# Patient Record
Sex: Male | Born: 1970 | Race: White | Hispanic: No | Marital: Married | Smoking: Former smoker
Health system: Southern US, Community
[De-identification: ages and names within clinical notes are randomized; demographics above are authoritative.]

## PROBLEM LIST (undated history)

## (undated) DIAGNOSIS — I10 Essential (primary) hypertension: Secondary | ICD-10-CM

## (undated) DIAGNOSIS — R42 Dizziness and giddiness: Secondary | ICD-10-CM

## (undated) DIAGNOSIS — E785 Hyperlipidemia, unspecified: Secondary | ICD-10-CM

## (undated) DIAGNOSIS — M1990 Unspecified osteoarthritis, unspecified site: Secondary | ICD-10-CM

## (undated) DIAGNOSIS — C4491 Basal cell carcinoma of skin, unspecified: Secondary | ICD-10-CM

## (undated) HISTORY — PX: COLONOSCOPY: SHX174

## (undated) HISTORY — PX: FOOT SURGERY: SHX648

## (undated) HISTORY — PX: BASAL CELL CARCINOMA EXCISION: SHX1214

## (undated) HISTORY — DX: Hyperlipidemia, unspecified: E78.5

## (undated) HISTORY — PX: JOINT REPLACEMENT: SHX530

---

## 2017-10-25 LAB — BASIC METABOLIC PANEL: Glucose: 92

## 2017-10-25 LAB — LIPID PANEL
HDL: 43 (ref 35–70)
Triglycerides: 136 (ref 40–160)

## 2018-05-16 ENCOUNTER — Encounter (INDEPENDENT_AMBULATORY_CARE_PROVIDER_SITE_OTHER): Payer: Self-pay | Admitting: Orthopaedic Surgery

## 2018-05-16 ENCOUNTER — Ambulatory Visit (INDEPENDENT_AMBULATORY_CARE_PROVIDER_SITE_OTHER): Payer: 59

## 2018-05-16 ENCOUNTER — Other Ambulatory Visit: Payer: Self-pay

## 2018-05-16 ENCOUNTER — Ambulatory Visit (INDEPENDENT_AMBULATORY_CARE_PROVIDER_SITE_OTHER): Payer: 59 | Admitting: Orthopaedic Surgery

## 2018-05-16 VITALS — Ht 72.0 in | Wt 200.0 lb

## 2018-05-16 DIAGNOSIS — M1612 Unilateral primary osteoarthritis, left hip: Secondary | ICD-10-CM

## 2018-05-16 NOTE — Progress Notes (Signed)
Sent this note back to the PA-C who created it.    I explained I was not the primary care provider and the patient has never been seen here /has never established care here.

## 2018-05-16 NOTE — Progress Notes (Signed)
Office Visit Note   Patient: Randy Bell           Date of Birth: 04/01/1970           MRN: [id number] Visit Date: 05/16/2018              Requested by: No referring provider defined for this encounter. PCP: Mellody Dance, DO   Assessment & Plan: Visit Diagnoses:  1. Primary osteoarthritis of left hip     Plan: Discussed with him at length his radiographic findings.  Also Dr. Ninfa Linden and myself discussed with him that if intra-articular injections are working for him that he can continue with this.  However if some point time the begin to be not effective for his hip arthritis becomes worse that he would be candidate for a total hip replacement.  Questions were encouraged and answered at length by Dr. Ninfa Linden myself.  He wishes to have a intra-articular injection in the near future with Dr. Junius Roads here in our office under ultrasound.  He will let us know what type of response he had to this injection.  Follow-up with [place] as needed.  Follow-Up Instructions: Return if symptoms worsen or fail to improve.   Orders:  Orders Placed This Encounter  Procedures   XR HIP UNILAT W OR W/O PELVIS 1V LEFT   No orders of the defined types were placed in this encounter.     Procedures: No procedures performed   Clinical Data: No additional findings.   Subjective: Chief Complaint  Patient presents with   Left Hip - Pain    HPI Randy Bell is a 48 year old male comes in today for second opinion on his left hip arthritis.  He has been seen by an orthopedic physician here in town he was found to have moderate to severe hip arthritis of the left hip based on the surgeon's note.  He was sent for intra-articular injections in the hip which hip given him good relief lasting anywhere from 6 months to a year.  He has had a total of 6 injections.  He is now having increased pain in the left hip since undergoing an intra-articular injection on 09/27/2017.  Has had no new injury to the left hip.   He is starting to have some right hip pain.  However he states is nothing like the left hip pain he gets to be between injections.  He has pain in the left hip spine ongoing for the past 3 years.  He does like to cycle play basketball with his daughter.  He is nondiabetic.  Review of Systems Negative for fevers chills.  Please see HPI otherwise negative or noncontributory.  Objective: Vital Signs: Ht 6' (1.829 m)    Wt 200 lb (90.7 kg)    BMI 27.12 kg/m   Physical Exam Constitutional:      Appearance: He is not ill-appearing or diaphoretic.  Pulmonary:     Effort: Pulmonary effort is normal.  Neurological:     Mental Status: He is alert and oriented to person, place, and time.  Psychiatric:        Mood and Affect: Mood normal.     Ortho Exam Right hip good range of motion without pain.  Left hip with limited internal and external rotation with discomfort. Specialty Comments:  No specialty comments available.  Imaging: Xr Hip Unilat W Or W/o Pelvis 1v Left  Result Date: 05/16/2018 AP pelvis lateral view of the left hip: No acute fractures.  Signs  of impingement both hips left greater than right.  Narrowing of the left hip joint moderate.  Mild to moderate narrowing right hip.  Both hips are well located.    PMFS History: Patient Active Problem List   Diagnosis Date Noted   Primary osteoarthritis of left hip 05/16/2018   No past medical history on file.   Social History   Occupational History   Not on file  Tobacco Use   Smoking status: Not on file  Substance and Sexual Activity   Alcohol use: Not on file   Drug use: Not on file   Sexual activity: Not on file

## 2018-05-17 ENCOUNTER — Encounter (INDEPENDENT_AMBULATORY_CARE_PROVIDER_SITE_OTHER): Payer: Self-pay | Admitting: Family Medicine

## 2018-05-17 ENCOUNTER — Ambulatory Visit (INDEPENDENT_AMBULATORY_CARE_PROVIDER_SITE_OTHER): Payer: 59 | Admitting: Family Medicine

## 2018-05-17 DIAGNOSIS — M1612 Unilateral primary osteoarthritis, left hip: Secondary | ICD-10-CM | POA: Diagnosis not present

## 2018-05-17 MED ORDER — METHYLPREDNISOLONE ACETATE 40 MG/ML IJ SUSP: 40.0000 mg | Freq: Once | INTRAMUSCULAR | Status: DC

## 2018-05-17 NOTE — Progress Notes (Signed)
Subjective: Patient is here for ultrasound-guided intra-articular left hip injection.  Objective:  Pain and decreased ROM with passive IR.  Procedure: Ultrasound-guided left hip injection: After sterile prep with Betadine, injected 8 cc 1% lidocaine without epinephrine and 40 mg methylprednisolone using a 22-gauge spinal needle, passing the needle through the iliofemoral ligament into the femoral head/neck junction.  injectate was seen filling the joint capsule.  RTC prn with Dr. Ninfa Linden.  Also try to eliminate sugar from diet to help with joint pain.

## 2018-08-01 ENCOUNTER — Other Ambulatory Visit: Payer: Self-pay

## 2018-08-01 ENCOUNTER — Encounter: Payer: Self-pay | Admitting: Family Medicine

## 2018-08-01 ENCOUNTER — Ambulatory Visit (INDEPENDENT_AMBULATORY_CARE_PROVIDER_SITE_OTHER): Payer: 59 | Admitting: Family Medicine

## 2018-08-01 VITALS — BP 136/86 | HR 56 | Temp 98.1°F | Ht 72.0 in | Wt 205.4 lb

## 2018-08-01 DIAGNOSIS — R001 Bradycardia, unspecified: Secondary | ICD-10-CM

## 2018-08-01 DIAGNOSIS — M1612 Unilateral primary osteoarthritis, left hip: Secondary | ICD-10-CM

## 2018-08-01 DIAGNOSIS — Z83438 Family history of other disorder of lipoprotein metabolism and other lipidemia: Secondary | ICD-10-CM | POA: Insufficient documentation

## 2018-08-01 DIAGNOSIS — R42 Dizziness and giddiness: Secondary | ICD-10-CM

## 2018-08-01 DIAGNOSIS — Z823 Family history of stroke: Secondary | ICD-10-CM | POA: Insufficient documentation

## 2018-08-01 DIAGNOSIS — Z7689 Persons encountering health services in other specified circumstances: Secondary | ICD-10-CM

## 2018-08-01 DIAGNOSIS — R002 Palpitations: Secondary | ICD-10-CM | POA: Diagnosis not present

## 2018-08-01 NOTE — Progress Notes (Signed)
New patient office visit note:  Impression and Recommendations:    1. Encounter to establish care with new doctor   2. 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   3. Fluttering sensation of heart-lasts a couple seconds only   4. Bradycardia, unspecified   5. Family history of hyperlipidemia- Mom and Dad   72. Family history of stroke or transient ischemic attack in father   50. Primary osteoarthritis of left hip-sees Dr. Zollie Beckers     Encounter to establish care with new doctor - Plan: Needs CPE with fasting blood work near future.  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   - Plan: Advised to check blood pressure and pulse at home.  Especially if has symptoms.  His symptoms are chronic and have not changed in many many years.  Extensive counseling done with patient regarding possible causes for these symptoms  Fluttering sensation of heart-lasts a couple seconds only    - Plan:  Red flag sx d/c pt- he has none. If dev- call us or to ED if serious.  Fluttering sensation in the heart only last 2 to 3 seconds and only notices it when he is resting.  Never notices it with activity or exertion.  Sx not getting more frequent and has been a chronic symptom he has had for many many months now without change.  Reassuring that it only is noticeable with rest and only lasts a couple seconds. - Extensive counseling done with patient regarding possible causes for these symptoms -Obtain labs to look for electrolyte abnormality, thyroid abnormality etc. Etc. -If labs are within normal limits, and symptoms worsen or do not improve at all, we will send patient for [facility] monitoring plus minus cards eval -For now advised to check blood pressure and pulse whenever this happens and try to know if this is related to any medications such as decongestants etc..  He will bring this symptom log in next OV when we have a chronic follow-up.  Family history of hyperlipidemia- Mom and Dad - Plan: We  will check labs near future.  Family history of stroke or transient ischemic attack in father   Primary osteoarthritis of left hip-sees Dr. Zollie Beckers - Plan: We will continue with Ortho as needed    Education and routine counseling performed. Handouts provided.    Patient declines labs today and wishes to come in for a physical and get fasting blood work all at once.  Advised patient to do this in the near future.   Medications Discontinued During This Encounter  Medication Reason  . methylPREDNISolone acetate (DEPO-MEDROL) injection 40 mg Completed Course    Expresses verbal understanding and consents to current therapy plan and treatment regimen.   Return for Follow-up next available for complete physical and come fasting.  (Within a couple weeks)   Please see AVS handed out to patient at the end of our visit for further patient instructions/ counseling done pertaining to today's office visit.    Note:  This document was prepared using Dragon voice recognition software and may include unintentional dictation errors.  ---------------------------------------------------------------------------------------------------------------------------------------------------------------------------------------------    Subjective:    Chief complaint:   Chief Complaint  Patient presents with  . Establish Care     HPI: Randy Bell is a pleasant 48 y.o. male who presents to [place] [facility] today to review their medical history with me and establish care.   I asked the patient to review their chronic problem list  with me to ensure everything was updated and accurate.    All recent office visits with other providers, any medical records that patient brought in etc  - I reviewed today.     We asked pt to get [place] their medical records from [facility] providers/ specialists that they had seen within the past 3-5 years- if they are in private practice and/or do not  work for [employer], [facility], [place], [place] or [employer] owned practice.  Told them to call their specialists to clarify this if they are not sure.    *last time at doc office was at age 48yo. Has not been since because he does not believe in them. Here for wife Lexie whom I see.   Long h/o medical noncompliance.   L hip-seen by Ortho.  Symptoms stable.  Continues to follow-up with them.  Changed to chris blackman recently   Bruised his ribs back a week or so ago.  Symptoms are not terrible, no shortness of breath, difficulty lying flat, pain with chest excursion etc.  No concerns today.  Just wanted to let me know.   At age 17 yo- got up to awaken from sleep to hit alarm clock and he passed right out- got lightheadedness.    NO LOC.  No memory loss.  Never happened again "until recently a little."   Just wanted to let me know.   This was last time he went to the doctors.     Rides bike 100 miles w/o difficulty but when I pick up daughjter and put her in bed and go to stand up too quick - feels dizzy- woozy/ lightheaded,  no vertigo.  No focal neurological symptoms.  No associated heart palpitations, visual changes, headaches etc. does not know what his baseline heart rate and blood pressures are.   Feels like heart is fluttering occasionally- maybe once monthly or once every couple months- lasts just "a few seconds."  Going on "long time now".   Only occ occurs with rest - is when he notices it.  With intense exercise- no sx ever / never noticed it.       Wt Readings from Last 3 Encounters:  08/01/18 205 lb 6.4 oz (93.2 kg)  05/16/18 200 lb (90.7 kg)   BP Readings from Last 3 Encounters:  08/01/18 136/86   Pulse Readings from Last 3 Encounters:  08/01/18 (!) 56   BMI Readings from Last 3 Encounters:  08/01/18 27.86 kg/m  05/16/18 27.12 kg/m    Patient Care Team    Relationship Specialty Notifications Start End  Mellody Dance, DO PCP - General Family Medicine  08/01/18    Eunice Blase, MD  Family Medicine  08/01/18   Mcarthur Rossetti, MD Consulting Physician Orthopedic Surgery  08/01/18    Comment: L hip    Patient Active Problem List   Diagnosis Date Noted  . Bradycardia, unspecified 08/01/2018    Priority: High  . Fluttering sensation of heart- with rest and lasts 2-3 sec 08/01/2018    Priority: High  . 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 Primary osteoarthritis of left hip 05/16/2018    Priority: Medium  . Family history of stroke or transient ischemic attack in father 08/01/2018  . Family history of hyperlipidemia- Mom and Dad 08/01/2018       As reported by pt:  History reviewed. No pertinent past medical history.   History reviewed. No pertinent surgical history.   Family History  Problem Relation Age of Onset  . Hyperlipidemia Mother   . Hyperlipidemia Father   . Stroke Father      Social History   Substance and Sexual Activity  Drug Use Never     Social History   Substance and Sexual Activity  Alcohol Use Yes  . Alcohol/week: 6.0 standard drinks  . Types: 6 Standard drinks or equivalent per week     Social History   Tobacco Use  Smoking Status Former Smoker  . Years: 3.00  . Types: Cigarettes  Smokeless Tobacco Never Used  Tobacco Comment   quit 30 years      No outpatient medications have been marked as taking for the 08/01/18 encounter (Office Visit) with Mellody Dance, DO.    Allergies: Patient has no known allergies.   Review of Systems  Constitutional: Negative for chills, diaphoresis, fever, malaise/fatigue and weight loss.  HENT: Negative for congestion, sore throat and tinnitus.   Eyes: Negative for blurred vision, double vision and photophobia.  Respiratory: Negative for cough and wheezing.   Cardiovascular: Positive for palpitations. Negative for chest pain.       Occ sx bradycardia  Gastrointestinal: Negative for blood in stool, diarrhea, nausea  and vomiting.  Genitourinary: Negative for dysuria, frequency and urgency.  Musculoskeletal: Positive for joint pain. Negative for myalgias.       L Hip   Skin: Negative for itching and rash.  Neurological: Positive for dizziness. Negative for focal weakness, weakness and headaches.       Orthostatic dizziness   Endo/Heme/Allergies: Negative for environmental allergies and polydipsia. Does not bruise/bleed easily.  Psychiatric/Behavioral: Negative for depression and memory loss. The patient is not nervous/anxious and does not have insomnia.       Objective:   Blood pressure 136/86, pulse (!) 56, temperature 98.1 F (36.7 C), height 6' (1.829 m), weight 205 lb 6.4 oz (93.2 kg), SpO2 98 %. Body mass index is 27.86 kg/m. General: Well Developed, well nourished, and in no acute distress.  Neuro: Alert and oriented x3, extra-ocular muscles intact, sensation grossly intact.  HEENT:[place]/AT, PERRLA, neck supple, No carotid bruits Skin: no gross rashes  Cardiac: Regular rate and rhythm, no M/R/G Respiratory: Essentially clear to auscultation bilaterally. Not using accessory muscles, speaking in full sentences. No palpable localized pain in ribs, no boney TTP, no STS or bruise apprec Abdominal: not grossly distended Musculoskeletal: Ambulates w/o diff, FROM * 4 ext.  Vasc: less 2 sec cap RF, warm and pink  Psych:  No HI/SI, judgement and insight good, Euthymic mood. Full Affect.    No results found for this or any previous visit (from the past 2160 hour(s)).

## 2018-08-01 NOTE — Patient Instructions (Signed)
Please realize, EXERCISE IS MEDICINE!  -  American Heart Association [facility]) guidelines for exercise : If you are in good health, without any medical conditions, you should engage in 150-300 minutes of moderate intensity aerobic activity per week.  This means you should be huffing and puffing throughout your workout.   Engaging in regular exercise will improve brain function and memory, as well as improve mood, boost immune system and help with weight management.  As well as the other, more well-known effects of exercise such as decreasing blood sugar levels, decreasing blood pressure,  and decreasing bad cholesterol levels/ increasing good cholesterol levels.     -  The AHA strongly endorses consumption of a diet that contains a variety of foods from all the food categories with an emphasis on fruits and vegetables; fat-free and low-fat dairy products; cereal and grain products; legumes and nuts; and fish, poultry, and/or extra lean meats.    Excessive food intake, especially of foods high in saturated and trans fats, sugar, and salt, should be avoided.    Adequate water intake of roughly 1/2 of your weight in pounds, should equal the ounces of water per day you should drink.  So for instance, if you're 200 pounds, that would be 100 ounces of water per day.  Additionally if you are exercising you should add 1 bottle water for every 30 minutes of exercise or activity that causes sweating         Mediterranean Diet  Why follow it? Research shows. . Those who follow the Mediterranean diet have a reduced risk of heart disease  . The diet is associated with a reduced incidence of Parkinson's and Alzheimer's diseases . People following the diet may have longer life expectancies and lower rates of chronic diseases  . The Dietary Guidelines for Americans recommends the Mediterranean diet as an eating plan to promote health and prevent disease  What Is the Mediterranean Diet?  . Healthy eating plan based  on typical foods and recipes of Mediterranean-style cooking . The diet is primarily a plant based diet; these foods should make up a majority of meals   Starches - Plant based foods should make up a majority of meals - They are an important sources of vitamins, minerals, energy, antioxidants, and fiber - Choose whole grains, foods high in fiber and minimally processed items  - Typical grain sources include wheat, oats, barley, corn, brown rice, bulgar, farro, millet, polenta, couscous  - Various types of beans include chickpeas, lentils, fava beans, black beans, white beans   Fruits  Veggies - Large quantities of antioxidant rich fruits & veggies; 6 or more servings  - Vegetables can be eaten raw or lightly drizzled with oil and cooked  - Vegetables common to the traditional Mediterranean Diet include: artichokes, arugula, beets, broccoli, brussel sprouts, cabbage, carrots, celery, collard greens, cucumbers, eggplant, kale, leeks, lemons, lettuce, mushrooms, okra, onions, peas, peppers, potatoes, pumpkin, radishes, rutabaga, shallots, spinach, sweet potatoes, turnips, zucchini - Fruits common to the Mediterranean Diet include: apples, apricots, avocados, cherries, clementines, dates, figs, grapefruits, grapes, melons, nectarines, oranges, peaches, pears, pomegranates, strawberries, tangerines  Fats - Replace butter and margarine with healthy oils, such as olive oil, canola oil, and tahini  - Limit nuts to no more than a handful a day  - Nuts include walnuts, almonds, pecans, pistachios, pine nuts  - Limit or avoid candied, honey roasted or heavily salted nuts - Olives are central to the Mediterranean diet - can be eaten whole  or used in a variety of dishes   Meats Protein - Limiting red meat: no more than a few times a month - When eating red meat: choose lean cuts and keep the portion to the size of deck of cards - Eggs: approx. 0 to 4 times a week  - Fish and lean poultry: at least 2 a week  -  Healthy protein sources include, chicken, [place], lean beef, lamb - Increase intake of seafood such as tuna, salmon, trout, mackerel, shrimp, scallops - Avoid or limit high fat processed meats such as sausage and bacon  Dairy - Include moderate amounts of low fat dairy products  - Focus on healthy dairy such as fat free yogurt, skim milk, low or reduced fat cheese - Limit dairy products higher in fat such as whole or 2% milk, cheese, ice cream  Alcohol - Moderate amounts of red wine is ok  - No more than 5 oz daily for women (all ages) and men older than age 2  - No more than 10 oz of wine daily for men younger than 45  Other - Limit sweets and other desserts  - Use herbs and spices instead of salt to flavor foods  - Herbs and spices common to the traditional Mediterranean Diet include: basil, bay leaves, chives, cloves, cumin, fennel, garlic, lavender, marjoram, mint, oregano, parsley, pepper, rosemary, sage, savory, sumac, tarragon, thyme   It's not just a diet, it's a lifestyle:  . The Mediterranean diet includes lifestyle factors typical of those in the region  . Foods, drinks and meals are best eaten with others and savored . Daily physical activity is important for overall good health . This could be strenuous exercise like running and aerobics . This could also be more leisurely activities such as walking, housework, yard-work, or taking the stairs . Moderation is the key; a balanced and healthy diet accommodates most foods and drinks . Consider portion sizes and frequency of consumption of certain foods   Meal Ideas & Options:  . Breakfast:  o Whole wheat toast or whole wheat English muffins with peanut butter & hard boiled egg o Steel cut oats topped with apples & cinnamon and skim milk  o Fresh fruit: banana, strawberries, melon, berries, peaches  o Smoothies: strawberries, bananas, greek yogurt, peanut butter o Low fat greek yogurt with blueberries and granola  o Egg white  omelet with spinach and mushrooms o Breakfast couscous: whole wheat couscous, apricots, skim milk, cranberries  . Sandwiches:  o Hummus and grilled vegetables (peppers, zucchini, squash) on whole wheat bread   o Grilled chicken on whole wheat pita with lettuce, tomatoes, cucumbers or tzatziki  o Tuna salad on whole wheat bread: tuna salad made with greek yogurt, olives, red peppers, capers, green onions o Garlic rosemary lamb pita: lamb sauted with garlic, rosemary, salt & pepper; add lettuce, cucumber, greek yogurt to pita - flavor with lemon juice and black pepper  . Seafood:  o Mediterranean grilled salmon, seasoned with garlic, basil, parsley, lemon juice and black pepper o Shrimp, lemon, and spinach whole-grain pasta salad made with low fat greek yogurt  o Seared scallops with lemon orzo  o Seared tuna steaks seasoned salt, pepper, coriander topped with tomato mixture of olives, tomatoes, olive oil, minced garlic, parsley, green onions and cappers  . Meats:  o Herbed greek chicken salad with kalamata olives, cucumber, feta  o Red bell peppers stuffed with spinach, bulgur, lean ground beef (or lentils) & topped with feta  o Kebabs: skewers of chicken, tomatoes, onions, zucchini, squash  o [place] burgers: made with red onions, mint, dill, lemon juice, feta cheese topped with roasted red peppers . Vegetarian o Cucumber salad: cucumbers, artichoke hearts, celery, red onion, feta cheese, tossed in olive oil & lemon juice  o Hummus and whole grain pita points with a greek salad (lettuce, tomato, feta, olives, cucumbers, red onion) o Lentil soup with celery, carrots made with vegetable broth, garlic, salt and pepper  o Tabouli salad: parsley, bulgur, mint, scallions, cucumbers, tomato, radishes, lemon juice, olive oil, salt and pepper.

## 2018-09-19 ENCOUNTER — Ambulatory Visit (INDEPENDENT_AMBULATORY_CARE_PROVIDER_SITE_OTHER): Payer: 59 | Admitting: Family Medicine

## 2018-09-19 ENCOUNTER — Other Ambulatory Visit: Payer: Self-pay

## 2018-09-19 ENCOUNTER — Encounter: Payer: Self-pay | Admitting: Family Medicine

## 2018-09-19 VITALS — BP 164/82 | HR 53 | Ht 71.5 in | Wt 201.0 lb

## 2018-09-19 DIAGNOSIS — Z23 Encounter for immunization: Secondary | ICD-10-CM

## 2018-09-19 DIAGNOSIS — Z719 Counseling, unspecified: Secondary | ICD-10-CM | POA: Diagnosis not present

## 2018-09-19 DIAGNOSIS — Z0000 Encounter for general adult medical examination without abnormal findings: Secondary | ICD-10-CM | POA: Diagnosis not present

## 2018-09-19 DIAGNOSIS — Z1211 Encounter for screening for malignant neoplasm of colon: Secondary | ICD-10-CM | POA: Diagnosis not present

## 2018-09-19 LAB — POC HEMOCCULT BLD/STL (OFFICE/1-CARD/DIAGNOSTIC): Fecal Occult Blood, POC: NEGATIVE

## 2018-09-19 MED ORDER — SHINGRIX 50 MCG/0.5ML IM SUSR: 0.5000 mL | Freq: Once | INTRAMUSCULAR | 1 refills | Status: AC

## 2018-09-19 NOTE — Patient Instructions (Signed)
 Preventive Care 50 Years and Older, Male Preventive care refers to lifestyle choices and visits with your health care provider that can promote health and wellness. What does preventive care include?   A yearly physical exam. This is also called an annual well check.  Dental exams once or twice a year.  Routine eye exams. Ask your health care provider how often you should have your eyes checked.  Personal lifestyle choices, including: ? Daily care of your teeth and gums. ? Regular physical activity. ? Eating a healthy diet. ? Avoiding tobacco and drug use. ? Limiting alcohol use. ? Practicing safe sex. ? Taking low doses of aspirin every day. ? Taking vitamin and mineral supplements as recommended by your health care provider. What happens during an annual well check? The services and screenings done by your health care provider during your annual well check will depend on your age, overall health, lifestyle risk factors, and family history of disease. Counseling Your health care provider may ask you questions about your:  Alcohol use.  Tobacco use.  Drug use.  Emotional well-being.  Home and relationship well-being.  Sexual activity.  Eating habits.  History of falls.  Memory and ability to understand (cognition).  Work and work environment. Screening You may have the following tests or measurements:  Height, weight, and BMI.  Blood pressure.  Lipid and cholesterol levels. These may be checked every 5 years, or more frequently if you are over 50 years old.  Skin check.  Lung cancer screening. You may have this screening every year starting at age 55 if you have a 30-pack-year history of smoking and currently smoke or have quit within the past 15 years.  Colorectal cancer screening. All adults should have this screening starting at age 50 and continuing until age 75. You will have tests every 1-10 years, depending on your results and the type of screening  test. People at increased risk should start screening at an earlier age. Screening tests may include: ? Guaiac-based fecal occult blood testing. ? Fecal immunochemical test (FIT). ? Stool DNA test. ? Virtual colonoscopy. ? Sigmoidoscopy. During this test, a flexible tube with a tiny camera (sigmoidoscope) is used to examine your rectum and lower colon. The sigmoidoscope is inserted through your anus into your rectum and lower colon. ? Colonoscopy. During this test, a long, thin, flexible tube with a tiny camera (colonoscope) is used to examine your entire colon and rectum.  Prostate cancer screening. Recommendations will vary depending on your family history and other risks.  Hepatitis C blood test.  Hepatitis B blood test.  Sexually transmitted disease (STD) testing.  Diabetes screening. This is done by checking your blood sugar (glucose) after you have not eaten for a while (fasting). You may have this done every 1-3 years.  Abdominal aortic aneurysm (AAA) screening. You may need this if you are a current or former smoker.  Osteoporosis. You may be screened starting at age 70 if you are at high risk. Talk with your health care provider about your test results, treatment options, and if necessary, the need for more tests. Vaccines Your health care provider may recommend certain vaccines, such as:  Influenza vaccine. This is recommended every year.  Tetanus, diphtheria, and acellular pertussis (Tdap, Td) vaccine. You may need a Td booster every 10 years.  Varicella vaccine. You may need this if you have not been vaccinated.  Zoster vaccine. You may need this after age 60.  Measles, mumps, and rubella (MMR)   vaccine. You may need at least one dose of MMR if you were born in 1957 or later. You may also need a second dose.  Pneumococcal 13-valent conjugate (PCV13) vaccine. One dose is recommended after age 24.  Pneumococcal polysaccharide (PPSV23) vaccine. One dose is recommended  after age 30.  Meningococcal vaccine. You may need this if you have certain conditions.  Hepatitis A vaccine. You may need this if you have certain conditions or if you travel or work in places where you may be exposed to hepatitis A.  Hepatitis B vaccine. You may need this if you have certain conditions or if you travel or work in places where you may be exposed to hepatitis B.  Haemophilus influenzae type b (Hib) vaccine. You may need this if you have certain risk factors. Talk to your health care provider about which screenings and vaccines you need and how often you need them. This information is not intended to replace advice given to you by your health care provider. Make sure you discuss any questions you have with your health care provider. Document Released: 02/07/2015 Document Revised: 03/03/2017 Document Reviewed: 11/12/2014 Elsevier Interactive Patient Education  2019 [place] for Adults, Male A healthy lifestyle and preventive care can promote health and wellness. Preventive health guidelines for men include the following key practices:  A routine yearly physical is a good way to check with your health care provider about your health and preventative screening. It is a chance to share any concerns and updates on your health and to receive a thorough exam.  Visit your dentist for a routine exam and preventative care every 6 months. Brush your teeth twice a day and floss once a day. Good oral hygiene prevents tooth decay and gum disease.  The frequency of eye exams is based on your age, health, family medical history, use of contact lenses, and other factors. Follow your health care provider's recommendations for frequency of eye exams.  Eat a healthy diet. Foods such as vegetables, fruits, whole grains, low-fat dairy products, and lean protein foods contain the nutrients you need without too many calories. Decrease your intake of foods high in  solid fats, added sugars, and salt. Eat the right amount of calories for you. Get information about a proper diet from your health care provider, if necessary.  Regular physical exercise is one of the most important things you can do for your health. Most adults should get at least 150 minutes of moderate-intensity exercise (any activity that increases your heart rate and causes you to sweat) each week. In addition, most adults need muscle-strengthening exercises on 2 or more days a week.  Maintain a healthy weight. The body mass index (BMI) is a screening tool to identify possible weight problems. It provides an estimate of body fat based on height and weight. Your health care provider can find your BMI and can help you achieve or maintain a healthy weight. For adults 20 years and older:  A BMI below 18.5 is considered underweight.  A BMI of 18.5 to 24.9 is normal.  A BMI of 25 to 29.9 is considered overweight.  A BMI of 30 and above is considered obese.  Maintain normal blood lipids and cholesterol levels by exercising and minimizing your intake of saturated fat. Eat a balanced diet with plenty of fruit and vegetables. Blood tests for lipids and cholesterol should begin at age 68 and be repeated every 5 years. If  your lipid or cholesterol levels are high, you are over 50, or you are at high risk for heart disease, you may need your cholesterol levels checked more frequently. Ongoing high lipid and cholesterol levels should be treated with medicines if diet and exercise are not working.  If you smoke, find out from your health care provider how to quit. If you do not use tobacco, do not start.  Lung cancer screening is recommended for adults aged 31-80 years who are at high risk for developing lung cancer because of a history of smoking. A yearly low-dose CT scan of the lungs is recommended for people who have at least a 30-pack-year history of smoking and are a current smoker or have quit within  the past 15 years. A pack year of smoking is smoking an average of 1 pack of cigarettes a day for 1 year (for example: 1 pack a day for 30 years or 2 packs a day for 15 years). Yearly screening should continue until the smoker has stopped smoking for at least 15 years. Yearly screening should be stopped for people who develop a health problem that would prevent them from having lung cancer treatment.  If you choose to drink alcohol, do not have more than 2 drinks per day. One drink is considered to be 12 ounces (355 mL) of beer, 5 ounces (148 mL) of wine, or 1.5 ounces (44 mL) of liquor.  Avoid use of street drugs. Do not share needles with anyone. Ask for help if you need support or instructions about stopping the use of drugs.  High blood pressure causes heart disease and increases the risk of stroke. Your blood pressure should be checked at least every 1-2 years. Ongoing high blood pressure should be treated with medicines, if weight loss and exercise are not effective.  If you are 55-53 years old, ask your health care provider if you should take aspirin to prevent heart disease.  Diabetes screening is done by taking a blood sample to check your blood glucose level after you have not eaten for a certain period of time (fasting). If you are not overweight and you do not have risk factors for diabetes, you should be screened once every 3 years starting at age 75. If you are overweight or obese and you are 32-46 years of age, you should be screened for diabetes every year as part of your cardiovascular risk assessment.  Colorectal cancer can be detected and often prevented. Most routine colorectal cancer screening begins at the age of 64 and continues through age 61. However, your health care provider may recommend screening at an earlier age if you have risk factors for colon cancer. On a yearly basis, your health care provider may provide home test kits to check for hidden blood in the stool. Use of a  small camera at the end of a tube to directly examine the colon (sigmoidoscopy or colonoscopy) can detect the earliest forms of colorectal cancer. Talk to your health care provider about this at age 60, when routine screening begins. Direct exam of the colon should be repeated every 5-10 years through age 56, unless early forms of precancerous polyps or small growths are found.  People who are at an increased risk for hepatitis B should be screened for this virus. You are considered at high risk for hepatitis B if:  You were born in a country where hepatitis B occurs often. Talk with your health care provider about which countries are considered high  a high-risk country and you have not received a shot to protect against hepatitis B (hepatitis B vaccine).  You have HIV or AIDS.  You use needles to inject street drugs.  You live with, or have sex with, someone who has hepatitis B.  You are a man who has sex with other men (MSM).  You get hemodialysis treatment.  You take certain medicines for conditions such as cancer, organ transplantation, and autoimmune conditions.  Hepatitis C blood testing is recommended for all people born from 1945 through 1965 and any individual with known risks for hepatitis C.  Practice safe sex. Use condoms and avoid high-risk sexual practices to reduce the spread of sexually transmitted infections (STIs). STIs include gonorrhea, chlamydia, syphilis, trichomonas, herpes, HPV, and human immunodeficiency virus (HIV). Herpes, HIV, and HPV are viral illnesses that have no cure. They can result in disability, cancer, and death.  If you are a man who has sex with other men, you should be screened at least once per year for:  HIV.  Urethral, rectal, and pharyngeal infection of gonorrhea, chlamydia, or both.  If you are at risk of being infected with HIV, it is recommended that you take a prescription medicine daily to prevent HIV infection. This  is called preexposure prophylaxis (PrEP). You are considered at risk if:  You are a man who has sex with other men (MSM) and have other risk factors.  You are a heterosexual man, are sexually active, and are at increased risk for HIV infection.  You take drugs by injection.  You are sexually active with a partner who has HIV.  Talk with your health care provider about whether you are at high risk of being infected with HIV. If you choose to begin PrEP, you should first be tested for HIV. You should then be tested every 3 months for as long as you are taking PrEP.  A one-time screening for abdominal aortic aneurysm (AAA) and surgical repair of large AAAs by ultrasound are recommended for men ages 65 to 75 years who are current or former smokers.  Healthy men should no longer receive prostate-specific antigen (PSA) blood tests as part of routine cancer screening. Talk with your health care provider about prostate cancer screening.  Testicular cancer screening is not recommended for adult males who have no symptoms. Screening includes self-exam, a health care provider exam, and other screening tests. Consult with your health care provider about any symptoms you have or any concerns you have about testicular cancer.  Use sunscreen. Apply sunscreen liberally and repeatedly throughout the day. You should seek shade when your shadow is shorter than you. Protect yourself by wearing long sleeves, pants, a wide-brimmed hat, and sunglasses year round, whenever you are outdoors.  Once a month, do a whole-body skin exam, using a mirror to look at the skin on your back. Tell your health care provider about new moles, moles that have irregular borders, moles that are larger than a pencil eraser, or moles that have changed in shape or color.  Stay current with required vaccines (immunizations).  Influenza vaccine. All adults should be immunized every year.  Tetanus, diphtheria, and acellular pertussis (Td,  Tdap) vaccine. An adult who has not previously received Tdap or who does not know his vaccine status should receive 1 dose of Tdap. This initial dose should be followed by tetanus and diphtheria toxoids (Td) booster doses every 10 years. Adults with an unknown or incomplete history of completing a 3-dose immunization series with   Td-containing vaccines should begin or complete a primary immunization series including a Tdap dose. Adults should receive a Td booster every 10 years.  Varicella vaccine. An adult without evidence of immunity to varicella should receive 2 doses or a second dose if he has previously received 1 dose.  Human papillomavirus (HPV) vaccine. Males aged 11-21 years who have not received the vaccine previously should receive the 3-dose series. Males aged 22-26 years may be immunized. Immunization is recommended through the age of 26 years for any male who has sex with males and did not get any or all doses earlier. Immunization is recommended for any person with an immunocompromised condition through the age of 26 years if he did not get any or all doses earlier. During the 3-dose series, the second dose should be obtained 4-8 weeks after the first dose. The third dose should be obtained 24 weeks after the first dose and 16 weeks after the second dose.  Zoster vaccine. One dose is recommended for adults aged 60 years or older unless certain conditions are present.  Measles, mumps, and rubella (MMR) vaccine. Adults born before 1957 generally are considered immune to measles and mumps. Adults born in 1957 or later should have 1 or more doses of MMR vaccine unless there is a contraindication to the vaccine or there is laboratory evidence of immunity to each of the three diseases. A routine second dose of MMR vaccine should be obtained at least 28 days after the first dose for students attending postsecondary schools, health care workers, or international travelers. People who received  inactivated measles vaccine or an unknown type of measles vaccine during 1963-1967 should receive 2 doses of MMR vaccine. People who received inactivated mumps vaccine or an unknown type of mumps vaccine before 1979 and are at high risk for mumps infection should consider immunization with 2 doses of MMR vaccine. Unvaccinated health care workers born before 1957 who lack laboratory evidence of measles, mumps, or rubella immunity or laboratory confirmation of disease should consider measles and mumps immunization with 2 doses of MMR vaccine or rubella immunization with 1 dose of MMR vaccine.  Pneumococcal 13-valent conjugate (PCV13) vaccine. When indicated, a person who is uncertain of his immunization history and has no record of immunization should receive the PCV13 vaccine. All adults 65 years of age and older should receive this vaccine. An adult aged 19 years or older who has certain medical conditions and has not been previously immunized should receive 1 dose of PCV13 vaccine. This PCV13 should be followed with a dose of pneumococcal polysaccharide (PPSV23) vaccine. Adults who are at high risk for pneumococcal disease should obtain the PPSV23 vaccine at least 8 weeks after the dose of PCV13 vaccine. Adults older than 50 years of age who have normal immune system function should obtain the PPSV23 vaccine dose at least 1 year after the dose of PCV13 vaccine.  Pneumococcal polysaccharide (PPSV23) vaccine. When PCV13 is also indicated, PCV13 should be obtained first. All adults aged 65 years and older should be immunized. An adult younger than age 65 years who has certain medical conditions should be immunized. Any person who resides in a nursing home or long-term care facility should be immunized. An adult smoker should be immunized. People with an immunocompromised condition and certain other conditions should receive both PCV13 and PPSV23 vaccines. People with human immunodeficiency virus (HIV) infection  should be immunized as soon as possible after diagnosis. Immunization during chemotherapy or radiation therapy should be avoided. Routine   use of PPSV23 vaccine is not recommended for American Indians, [place] Natives, or people younger than 65 years unless there are medical conditions that require PPSV23 vaccine. When indicated, people who have unknown immunization and have no record of immunization should receive PPSV23 vaccine. One-time revaccination 5 years after the first dose of PPSV23 is recommended for people aged 19-64 years who have chronic kidney failure, nephrotic syndrome, asplenia, or immunocompromised conditions. People who received 1-2 doses of PPSV23 before age 65 years should receive another dose of PPSV23 vaccine at age 65 years or later if at least 5 years have passed since the previous dose. Doses of PPSV23 are not needed for people immunized with PPSV23 at or after age 65 years.  Meningococcal vaccine. Adults with asplenia or persistent complement component deficiencies should receive 2 doses of quadrivalent meningococcal conjugate (MenACWY-D) vaccine. The doses should be obtained at least 2 months apart. Microbiologists working with certain meningococcal bacteria, military recruits, people at risk during an outbreak, and people who travel to or live in countries with a high rate of meningitis should be immunized. A first-year college student up through age 21 years who is living in a residence hall should receive a dose if he did not receive a dose on or after his 16th birthday. Adults who have certain high-risk conditions should receive one or more doses of vaccine.  Hepatitis A vaccine. Adults who wish to be protected from this disease, have chronic liver disease, work with hepatitis A-infected animals, work in hepatitis A research labs, or travel to or work in countries with a high rate of hepatitis A should be immunized. Adults who were previously unvaccinated and who anticipate close  contact with an international adoptee during the first 60 days after arrival in the [place] from a country with a high rate of hepatitis A should be immunized.  Hepatitis B vaccine. Adults should be immunized if they wish to be protected from this disease, are under age 59 years and have diabetes, have chronic liver disease, have had more than one sex partner in the past 6 months, may be exposed to blood or other infectious body fluids, are household contacts or sex partners of hepatitis B positive people, are clients or workers in certain care facilities, or travel to or work in countries with a high rate of hepatitis B.  Haemophilus influenzae type b (Hib) vaccine. A previously unvaccinated person with asplenia or sickle cell disease or having a scheduled splenectomy should receive 1 dose of Hib vaccine. Regardless of previous immunization, a recipient of a hematopoietic stem cell transplant should receive a 3-dose series 6-12 months after his successful transplant. Hib vaccine is not recommended for adults with HIV infection. Preventive Service / Frequency Ages 19 to 39  Blood pressure check.** / Every 3-5 years.  Lipid and cholesterol check.** / Every 5 years beginning at age 20.  Hepatitis C blood test.** / For any individual with known risks for hepatitis C.  Skin self-exam. / Monthly.  Influenza vaccine. / Every year.  Tetanus, diphtheria, and acellular pertussis (Tdap, Td) vaccine.** / Consult your health care provider. 1 dose of Td every 10 years.  Varicella vaccine.** / Consult your health care provider.  HPV vaccine. / 3 doses over 6 months, if 26 or younger.  Measles, mumps, rubella (MMR) vaccine.** / You need at least 1 dose of MMR if you were born in 1957 or later. You may also need a second dose.  Pneumococcal 13-valent conjugate (PCV13) vaccine.** /   Pneumococcal 13-valent conjugate (PCV13) vaccine.** / Consult your health care provider.  Pneumococcal polysaccharide (PPSV23) vaccine.** / 1 to 2 doses  if you smoke cigarettes or if you have certain conditions.  Meningococcal vaccine.** / 1 dose if you are age 26 to 70 years and a Market researcher living in a residence hall, or have one of several medical conditions. You may also need additional booster doses.  Hepatitis A vaccine.** / Consult your health care provider.  Hepatitis B vaccine.** / Consult your health care provider.  Haemophilus influenzae type b (Hib) vaccine.** / Consult your health care provider. Ages 46 to 61  Blood pressure check.** / Every year.  Lipid and cholesterol check.** / Every 5 years beginning at age 52.  Lung cancer screening. / Every year if you are aged 43-80 years and have a 30-pack-year history of smoking and currently smoke or have quit within the past 15 years. Yearly screening is stopped once you have quit smoking for at least 15 years or develop a health problem that would prevent you from having lung cancer treatment.  Fecal occult blood test (FOBT) of stool. / Every year beginning at age 70 and continuing until age 42. You may not have to do this test if you get a colonoscopy every 10 years.  Flexible sigmoidoscopy** or colonoscopy.** / Every 5 years for a flexible sigmoidoscopy or every 10 years for a colonoscopy beginning at age 33 and continuing until age 73.  Hepatitis C blood test.** / For all people born from [birth year] through 1965 and any individual with known risks for hepatitis C.  Skin self-exam. / Monthly.  Influenza vaccine. / Every year.  Tetanus, diphtheria, and acellular pertussis (Tdap/Td) vaccine.** / Consult your health care provider. 1 dose of Td every 10 years.  Varicella vaccine.** / Consult your health care provider.  Zoster vaccine.** / 1 dose for adults aged 74 years or older.  Measles, mumps, rubella (MMR) vaccine.** / You need at least 1 dose of MMR if you were born in 1957 or later. You may also need a second dose.  Pneumococcal 13-valent conjugate (PCV13)  vaccine.** / Consult your health care provider.  Pneumococcal polysaccharide (PPSV23) vaccine.** / 1 to 2 doses if you smoke cigarettes or if you have certain conditions.  Meningococcal vaccine.** / Consult your health care provider.  Hepatitis A vaccine.** / Consult your health care provider.  Hepatitis B vaccine.** / Consult your health care provider.  Haemophilus influenzae type b (Hib) vaccine.** / Consult your health care provider. Ages 60 and over  Blood pressure check.** / Every year.  Lipid and cholesterol check.**/ Every 5 years beginning at age 23.  Lung cancer screening. / Every year if you are aged 2-80 years and have a 30-pack-year history of smoking and currently smoke or have quit within the past 15 years. Yearly screening is stopped once you have quit smoking for at least 15 years or develop a health problem that would prevent you from having lung cancer treatment.  Fecal occult blood test (FOBT) of stool. / Every year beginning at age 109 and continuing until age 52. You may not have to do this test if you get a colonoscopy every 10 years.  Flexible sigmoidoscopy** or colonoscopy.** / Every 5 years for a flexible sigmoidoscopy or every 10 years for a colonoscopy beginning at age 51 and continuing until age 75.  Hepatitis C blood test.** / For all people born from [birth year] through 1967 and any individual with known risks  for hepatitis C.  Abdominal aortic aneurysm (AAA) screening.** / A one-time screening for ages 17 to 33 years who are current or former smokers.  Skin self-exam. / Monthly.  Influenza vaccine. / Every year.  Tetanus, diphtheria, and acellular pertussis (Tdap/Td) vaccine.** / 1 dose of Td every 10 years.  Varicella vaccine.** / Consult your health care provider.  Zoster vaccine.** / 1 dose for adults aged 60 years or older.  Pneumococcal 13-valent conjugate (PCV13) vaccine.** / 1 dose for all adults aged 41 years and older.  Pneumococcal  polysaccharide (PPSV23) vaccine.** / 1 dose for all adults aged 72 years and older.  Meningococcal vaccine.** / Consult your health care provider.  Hepatitis A vaccine.** / Consult your health care provider.  Hepatitis B vaccine.** / Consult your health care provider.  Haemophilus influenzae type b (Hib) vaccine.** / Consult your health care provider. **Family history and personal history of risk and conditions may change your health care provider's recommendations.   This information is not intended to replace advice given to you by your health care provider. Make sure you discuss any questions you have with your health care provider.     Testosterone Test Why am I having this test? Testosterone is a hormone made by the adrenal glands in the abdomen in both males and females. In males, it is also made by the testicles. Starting at puberty, testosterone stimulates the development of secondary sex characteristics in males. This includes a deeper voice, muscle and body hair growth, and penis enlargement. In females, testosterone is also produced in the ovaries. The male body converts testosterone into estradiol, the main male sex hormone. An abnormal level of testosterone can cause health issues in both males and females. You may have this test if your health care provider suspects that an abnormal testosterone level is causing or contributing to other health problems. In males, an abnormally low testosterone level can cause:  Inability to have children (infertility).  Trouble getting or maintaining an erection (erectile dysfunction).  Delayed puberty. In females, an abnormally high testosterone level can cause:  Infertility.  Polycystic ovary syndrome (PCOS).  Development of masculine features (virilization). What is being tested? This test measures the amount of total testosterone in your blood. What kind of sample is taken?  A blood sample is required for this test. It is  usually collected by inserting a needle into a blood vessel. The sample is most often collected in the morning because that is when testosterone is usually the highest. Tell a health care provider about:  Any allergies you have.  All medicines you are taking, including vitamins, herbs, eye drops, creams, and over-the-counter medicines.  Any blood disorders you have.  Any surgeries you have had.  Any medical conditions you have.  Whether you are pregnant or may be pregnant. How are the results reported? Your test results will be reported as a value that indicates how much testosterone is in your blood. This will be given as nanograms of testosterone per deciliter of blood (ng/dL). Your health care provider will compare your test results to normal ranges that were established after testing a large group of people (reference ranges). Reference ranges may vary among labs and hospitals. For this test, common reference ranges for total testosterone are:  Male: ? 7 months to 50 years old: less than 30 ng/dL. ? 67-46 years old: less than 300 ng/dL. ? 37-34 years old: 170-540 ng/dL. ? 50-47 years old: 250-910 ng/dL. ? 20 years and older: 280-1,080  ng/dL.  Male: ? 7 months to 50 years old: less than 30 ng/dL. ? 9-100 years old: less than 40 ng/dL. ? 69-44 years old: less than 60 ng/dL. ? 92-46 years old: less than 70 ng/dL. ? 20 years and older: less than 70 ng/dL. What do the results mean? A result that is within your reference range means that you have a normal amount of testosterone in your blood. In males:  A high testosterone level may mean that you: ? Have certain types of tumors. ? Have an overactive thyroid gland (hyperthyroidism). ? Currently use anabolic steroids or used anabolic steroids in the past. ? Have an inherited disorder that affects the adrenal glands (congenital adrenal hyperplasia). ? Are starting puberty early (precocious puberty).  A low testosterone level may  mean that you: ? Have certain genetic diseases. ? Have had certain viral infections, such as mumps. ? Have a condition that affects the pituitary gland. ? Have injured your testicles. In females:  A high testosterone level may mean that you have: ? Certain types of tumors, such as ovarian or adrenal gland tumors. ? An inherited disorder that affects certain cells in the adrenal glands (congenital adrenal hyperplasia). ? Polycystic ovary syndrome.  A low testosterone level usually will not cause health problems. Talk with your health care provider about what your results mean. Questions to ask your health care provider Ask your health care provider, or the department that is doing the test:  When will my results be ready?  How will I get my results?  What are my treatment options?  What other tests do I need?  What are my next steps? Summary  Testosterone is a hormone made by the adrenal glands in the abdomen in both males and females. In males, it is also made by the testicles. Starting at puberty, testosterone stimulates the development of secondary sex characteristics in males.  In females, testosterone is also produced in the ovaries. The male body converts testosterone into estradiol, the main male sex hormone.  An abnormal level of testosterone can cause health issues in both males and females. You may have this test if your health care provider suspects that an abnormal testosterone level is causing or contributing to other health problems. This information is not intended to replace advice given to you by your health care provider. Make sure you discuss any questions you have with your health care provider. Document Released: 01/29/2004 Document Revised: 10/12/2016 Document Reviewed: 10/12/2016 Elsevier Patient Education  2020 [place] Released: 03/09/2001 Document Revised: 02/01/2014 Document Reviewed: 06/08/2010 Elsevier Interactive Patient Education  [employer].

## 2018-09-19 NOTE — Progress Notes (Signed)
Male physical  Impression and Recommendations:    1. Encounter for wellness examination   2. Annual physical exam   3. Need for shingles vaccine   4. Health education/counseling   5. Screening for colon cancer     1) Anticipatory Guidance:   sunscreen when outside along with skin surveillance-he will continue with his dermatologist on a yearly basis for skin screenings.; eating a balanced and modest diet; physical activity at least 25 minutes per day or 150 min/ week moderate to intense activity.  - Prudent skin screening habits discussed with patient today. - Discussed need to look for changes in the skin and continue to follow up with dermatologist as established.  - Prudent self-testicular exam screening habits discussed with patient.  2) Immunizations / Screenings / Labs:  All immunizations are up-to-date per recommendations or will be updated today. Patient is due for dental and vision screens which pt will schedule independently. Will obtain CBC, CMP, HgA1c, Lipid panel, TSH and vit D when fasting, if not already done recently.   - Need for colonoscopy.  Extensive education provided today. - All questions were answered.   - Need for HIV / Hep C screening.  Discussed with patient today.  - Need for shingles vaccine.  Discussed with patient today and education provided. - Advised patient to call his insurance and see if the vaccination is covered and where to obtain it.  - Education provided today of methods to naturally improve his testosterone levels. - Encouraged patient to lift weights and continue to keep his body weight down. - Handout provided today regarding low testosterone for patient's education. - Patient knows to send MyChart message with sx and concerns PRN.  3) Weight - Body mass index is 27.64 kg/m BMI meaning discussed with patient.  Discussed goal of losing 5-10% of current body weight which would improve overall feelings of well being and improve  objective health data. Improve nutrient density of diet through increasing intake of fruits and vegetables and decreasing saturated fats, white flour products and refined sugars.   - Advised patient to continue working daily exercising to improve overall mental, physical, and emotional health.    - Encouraged patient to engage in daily physical activity, especially a formal exercise routine.  Recommended that the patient eventually strive for at least 150 minutes of moderate cardiovascular activity per week according to guidelines established by the [facility].   - Reviewed the "spokes of the wheel" of mood and health management.  Stressed the importance of ongoing prudent habits, including regular exercise, appropriate sleep hygiene, healthful dietary habits, and prayer/meditation to relax.  - Patient should also consume adequate amounts of water.  - Health counseling performed.  All questions answered.    Orders Placed This Encounter  Procedures  . CBC with Differential/Platelet    Standing Status:   Future    Standing Expiration Date:   09/19/2019  . Comp Met (CMET)  . Lipid Profile  . HgB A1c  . TSH    Standing Status:   Future    Standing Expiration Date:   09/19/2019  . T4, free  . T3  . HIV antibody (with reflex)  . Hepatitis C Antibody  . POC Hemoccult Bld/Stl (1-Cd Office Dx)    Meds ordered this encounter  Medications  . Zoster Vaccine Adjuvanted [facility]) injection    Sig: Inject 0.5 mLs into the muscle once for 1 dose.    Dispense:  0.5 mL    Refill:  1     Gross side effects, risk and benefits, and alternatives of medications discussed with patient.  Patient is aware that all medications have potential side effects and we are unable to predict every side effect or drug-drug interaction that may occur.  Expresses verbal understanding and consents to current therapy plan and treatment regimen.  Please see AVS handed out to patient at the end of our visit for further  patient instructions/ counseling done pertaining to today's office visit.  Follow-up preventative CPE in 1 year. Follow-up office visit pending lab work.  F/up sooner for chronic care management and/or prn  This document serves as a record of services personally performed by Mellody Dance, DO. It was created on her behalf by Toni Amend, a trained medical scribe. The creation of this record is based on the scribe's personal observations and the provider's statements to them.   I have reviewed the above medical documentation for accuracy and completeness and I concur.  Mellody Dance, DO 09/19/2018 9:04 AM       Subjective:    CC: CPE  HPI: Randy Bell is a 48 y.o. male who presents to [place] [facility] today for a yearly health maintenance exam.    Health Maintenance Summary Reviewed and updated, unless pt declines services.  Colonoscopy:  Need for colonoscopy in near future. Denies family history of colon cancer, including first-degree relatives with colon cancer. Tobacco History Reviewed:  Former smoker -3-pack-year history, quit over 30 years ago. Alcohol:    No concerns, no excessive use Exercise Habits:  Cycles for probably three to four hours per week.  Over 100 miles per week.   STD concerns:   None.  Monogamous Drug Use:   None. Birth control method:  None reported.  Applicable Testicular/penile concerns:  Denies concerns.    However, states "what is the process for checking testosterone?"  States he is concerned about testosterone as related to his performance in exercise and mildly regarding his sexual drive.  States he feels his sex drive is lower but "doesn't seem to be a problem at all for me or my wife."  His main concern is performance in exercising and wondered if he needed to know if his testosterone was low.  Denies chest pain, SOB, GI symptoms, "no, not really; nothing that I didn't already tell you about."  Regarding urinary  concerns, says things have changed "compared to when I was 28."  Notes he gets up to pee once a night every night, and used to be able to sleep eight hours without getting up.  Says "I'm not up every hour or two hours, but I definitely know there's a change there."  When he gets up in the morning, there's a difference in urinary stream, but "during the day, no."  Visual Health States he has no idea the last time he had an eye exam.  Dermatological Health Notes follows up with dermatology in [place].  States his provider is Dr. Evorn Gong through [facility].  Patient states he is "getting ready to have something removed" in September.    Says "we wear a lot of sunscreen but are in and out of the water all day long."  Patient states he had shingles and "that's why I started going to the dermatologist years ago."  Says he had it one time around age 77.  States he has an 43 year old daughter "who we think has had it twice already."    Immunization  History  Administered Date(s) Administered  . Hepatitis A 10/14/2005, 07/03/2010  . Hepatitis B 07/03/2010, 08/02/2010  . Influenza Inj Mdck Quad Pf 10/22/2017  . Td 03/29/2000  . Tdap 07/03/2010    Health Maintenance  Topic Date Due  . INFLUENZA VACCINE  08/26/2018  . COLONOSCOPY  08/01/2019 (Originally 06/07/2018)  . HIV Screening  08/01/2019 (Originally 06/07/1983)  . TETANUS/TDAP  07/02/2020      Wt Readings from Last 3 Encounters:  09/19/18 201 lb (91.2 kg)  08/01/18 205 lb 6.4 oz (93.2 kg)  05/16/18 200 lb (90.7 kg)   BP Readings from Last 3 Encounters:  09/19/18 (!) 164/82  08/01/18 136/86   Pulse Readings from Last 3 Encounters:  09/19/18 (!) 53  08/01/18 (!) 56    Patient Active Problem List   Diagnosis Date Noted  . Bradycardia, unspecified 08/01/2018    Priority: High  . Fluttering sensation of heart- with rest and lasts 2-3 sec 08/01/2018    Priority: High  . 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 Primary osteoarthritis of left hip 05/16/2018    Priority: Medium  . Family history of stroke or transient ischemic attack in father 08/01/2018  . Family history of hyperlipidemia- Mom and Dad 08/01/2018    No past medical history on file.  No past surgical history on file.  Family History  Problem Relation Age of Onset  . Hyperlipidemia Mother   . Hyperlipidemia Father   . Stroke Father     Social History   Substance and Sexual Activity  Drug Use Never  ,  Social History   Substance and Sexual Activity  Alcohol Use Yes  . Alcohol/week: 6.0 standard drinks  . Types: 6 Standard drinks or equivalent per week  ,  Social History   Tobacco Use  Smoking Status Former Smoker  . Years: 3.00  . Types: Cigarettes  Smokeless Tobacco Never Used  Tobacco Comment   quit 30 years   ,  Social History   Substance and Sexual Activity  Sexual Activity Not on file    Patient's Medications  New Prescriptions   ZOSTER VACCINE ADJUVANTED (SHINGRIX) INJECTION    Inject 0.5 mLs into the muscle once for 1 dose.  Previous Medications   No medications on file  Modified Medications   No medications on file  Discontinued Medications   No medications on file    Patient has no known allergies.  Review of Systems: General:   Denies fever, chills, unexplained weight loss.  Optho/Auditory:   Denies visual changes, blurred vision/LOV Respiratory:   Denies SOB, DOE more than baseline levels.  Cardiovascular:   Denies chest pain, palpitations, new onset peripheral edema  Gastrointestinal:   Denies nausea, vomiting, diarrhea.  Genitourinary: Denies dysuria, freq/ urgency, flank pain or discharge from genitals.  Endocrine:     Denies hot or cold intolerance, polyuria, polydipsia. Musculoskeletal:   Denies unexplained myalgias, joint swelling, unexplained arthralgias, gait problems.  Skin:  Denies rash, suspicious lesions Neurological:     Denies  dizziness, unexplained weakness, numbness  Psychiatric/Behavioral:   Denies mood changes, suicidal or homicidal ideations, hallucinations    Objective:     Blood pressure (!) 164/82, pulse (!) 53, height 5' 11.5" (1.816 m), weight 201 lb (91.2 kg), SpO2 100 %. Body mass index is 27.64 kg/m. General Appearance:    Alert, cooperative, no distress, appears stated age  Head:    Normocephalic, without obvious abnormality, atraumatic  Eyes:  PERRL, conjunctiva/corneas clear, EOM's intact, fundi    benign, both eyes  Ears:    Normal TM's and external ear canals, both ears  Nose:   Nares normal, septum midline, mucosa normal, no drainage    or sinus tenderness  Throat:   Lips w/o lesion, mucosa moist, and tongue normal; teeth and   gums normal  Neck:   Supple, symmetrical, trachea midline, no adenopathy;    thyroid:  no enlargement/tenderness/nodules; no carotid   bruit or JVD  Back:     Symmetric, no curvature, ROM normal, no CVA tenderness  Lungs:     Clear to auscultation bilaterally, respirations unlabored, no       Wh/ R/ R  Chest Wall:    No tenderness or gross deformity; normal excursion   Heart:    Regular rate and rhythm, S1 and S2 normal, no murmur, rub   or gallop  Abdomen:     Soft, non-tender, bowel sounds active all four quadrants, NO   G/R/R, no masses, no organomegaly  Genitalia:    Ext genitalia: without lesion, no penile rash or discharge, no hernias appreciated   Rectal:    Normal tone, prostate WNL's and equal b/l, no tenderness; guaiac negative stool  Extremities:   Extremities normal, atraumatic, no cyanosis or gross edema  Pulses:   2+ and symmetric all extremities  Skin:   Several solar keratoses throughout his skin, but otherwise no concerning lesions.  Warm, dry, Skin color, texture, turgor normal, no obvious rashes.  M-Sk:   Ambulates * 4 w/o difficulty, no gross deformities, tone WNL  Neurologic:   CNII-XII intact, normal strength, sensation and reflexes     Throughout Psych:  No HI/SI, judgement and insight good, Euthymic mood. Full Affect.

## 2018-09-20 ENCOUNTER — Telehealth: Payer: Self-pay | Admitting: Family Medicine

## 2018-09-20 ENCOUNTER — Telehealth: Payer: Self-pay

## 2018-09-20 LAB — COMPREHENSIVE METABOLIC PANEL
ALT: 19 IU/L (ref 0–44)
AST: 28 IU/L (ref 0–40)
Albumin/Globulin Ratio: 2.2 (ref 1.2–2.2)
Albumin: 4.7 g/dL (ref 4.0–5.0)
Alkaline Phosphatase: 112 IU/L (ref 39–117)
BUN/Creatinine Ratio: 14 (ref 9–20)
BUN: 17 mg/dL (ref 6–24)
Bilirubin Total: 0.7 mg/dL (ref 0.0–1.2)
CO2: 25 mmol/L (ref 20–29)
Calcium: 9.8 mg/dL (ref 8.7–10.2)
Chloride: 103 mmol/L (ref 96–106)
Creatinine, Ser: 1.18 mg/dL (ref 0.76–1.27)
GFR calc Af Amer: 83 mL/min/{1.73_m2} (ref >59)
GFR calc non Af Amer: 72 mL/min/{1.73_m2} (ref >59)
Globulin, Total: 2.1 g/dL (ref 1.5–4.5)
Glucose: 98 mg/dL (ref 65–99)
Potassium: 4.8 mmol/L (ref 3.5–5.2)
Sodium: 143 mmol/L (ref 134–144)
Total Protein: 6.8 g/dL (ref 6.0–8.5)

## 2018-09-20 LAB — LIPID PANEL
Chol/HDL Ratio: 6.4 ratio — ABNORMAL HIGH (ref 0.0–5.0)
Cholesterol, Total: 243 mg/dL — ABNORMAL HIGH (ref 100–199)
HDL: 38 mg/dL — ABNORMAL LOW (ref >39)
LDL Calculated: 174 mg/dL — ABNORMAL HIGH (ref 0–99)
Triglycerides: 154 mg/dL — ABNORMAL HIGH (ref 0–149)
VLDL Cholesterol Cal: 31 mg/dL (ref 5–40)

## 2018-09-20 LAB — HIV ANTIBODY (ROUTINE TESTING W REFLEX): HIV Screen 4th Generation wRfx: NONREACTIVE

## 2018-09-20 LAB — T3: T3, Total: 101 ng/dL (ref 71–180)

## 2018-09-20 LAB — HEPATITIS C ANTIBODY: Hep C Virus Ab: <0.1 s/co ratio (ref 0.0–0.9)

## 2018-09-20 LAB — T4, FREE: Free T4: 1.26 ng/dL (ref 0.82–1.77)

## 2018-09-20 LAB — HEMOGLOBIN A1C
Est. average glucose Bld gHb Est-mCnc: 108 mg/dL
Hgb A1c MFr Bld: 5.4 % (ref 4.8–5.6)

## 2018-09-20 NOTE — Telephone Encounter (Signed)
Patient states someone left him a message to call office ----FYI to medical assistant.  --Randy Bell

## 2018-09-20 NOTE — Telephone Encounter (Signed)
Attempted to reach patient. No answer. Vm left to call back  

## 2018-09-20 NOTE — Telephone Encounter (Signed)
Spoke with pt and advised of lab results.  Charyl Bigger, CMA

## 2018-09-20 NOTE — Telephone Encounter (Signed)
-----   Message from Mellody Dance, DO sent at 09/20/2018 12:22 PM EDT ----- Per patient's request he wanted to have a follow-up telehealth or doxy visit to discuss labs only if there were abnormalities that warrant a medication change- and unfortunately there are.   It is his cholesterol that is abnormally high.   He meets criteria to go on cholesterol medications.  If he'd like to talk about this further- make f/up appt or otherwise, let me know if he is ok just starting meds, then I will make recs.   Thnx!!  The 10-year ASCVD risk score Mikey Bussing DC Jr., et al., 2013) is: 9.2%   Values used to calculate the score:     Age: 48 years     Sex: Male     Is Non-Hispanic African American: No     Diabetic: No     Tobacco smoker: No     Systolic Blood Pressure: 123456 mmHg     Is BP treated: No     HDL Cholesterol: 38 mg/dL     Total Cholesterol: 243 mg/dL

## 2018-10-05 ENCOUNTER — Encounter: Payer: Self-pay | Admitting: Family Medicine

## 2018-10-05 ENCOUNTER — Ambulatory Visit (INDEPENDENT_AMBULATORY_CARE_PROVIDER_SITE_OTHER): Payer: 59 | Admitting: Family Medicine

## 2018-10-05 ENCOUNTER — Other Ambulatory Visit: Payer: Self-pay

## 2018-10-05 VITALS — Ht 72.0 in | Wt 203.0 lb

## 2018-10-05 DIAGNOSIS — E785 Hyperlipidemia, unspecified: Secondary | ICD-10-CM | POA: Diagnosis not present

## 2018-10-05 DIAGNOSIS — Z83438 Family history of other disorder of lipoprotein metabolism and other lipidemia: Secondary | ICD-10-CM

## 2018-10-05 DIAGNOSIS — Z823 Family history of stroke: Secondary | ICD-10-CM | POA: Diagnosis not present

## 2018-10-05 DIAGNOSIS — R030 Elevated blood-pressure reading, without diagnosis of hypertension: Secondary | ICD-10-CM | POA: Diagnosis not present

## 2018-10-05 NOTE — Progress Notes (Signed)
Virtual / live video office visit note for [employer], D.O- Primary Care Physician at [facility]   I connected with current patient today and beyond visually recognizing the correct individual, I verified that I am speaking with the correct person using two identifiers.   Location of the patient: Home  Location of the provider: Office Only the patient (+/- their family members at pt's discretion) and myself were participating in the encounter    - This visit type was conducted due to national recommendations for restrictions regarding the COVID-19 Pandemic (e.g. social distancing) in an effort to limit this patient's exposure and mitigate transmission in our community.  This format is felt to be most appropriate for this patient at this time.   - The patient did have access to video technology today   - No physical exam could be performed with this format, beyond that communicated to [place] by the patient/ family members as noted.   - Additionally my office staff/ schedulers discussed with the patient that there may be a monetary charge related to this service, depending on patient's medical insurance.   The patient expressed understanding, and agreed to proceed.      History of Present Illness:   Blood Pressure at Home:  He does not have a blood pressure cuff at home.  Notes he has not been checking it.  Cholesterol:  Says his LDL has never been that high; notes "I think that the office should have records from 2015, 2017, and 2019," and always around 125-145.  Notes for ten years, his cholesterol has been the same, but "not going any worse."  Says that his cholesterol has stayed "relatively the same," and "only because of that, out of partial ignorance, I had not been concerned [about my cholesterol]."  Says he can't exercise more than he currently does; "I just can't work in more than I'm currently doing."  Notes probably averages between 3-5 hours of cardiovascular activity  per week.  Says "I don't rev up like I used to because I'm 48, but I rev moderate and I ride hills and ride the road."  Notes ""I normally get in an hour every day five days a week at work at lunch, unless the weather is bad."  Notes "we don't really eat out much; we aren't really a [employer] family."  Says his father has had a couple of strokes and has been on cholesterol medication.  He isn't sure how old his father was when he had his first stroke, somewhere around age 69-65.  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  Notes he has heard "horror stories" about statins because of reports of joint pains, muscle pains, all kinds of different pains, and muscle reaction issues.   Last 3 blood pressure readings in our office are as follows: BP Readings from Last 3 Encounters:  09/19/18 (!) 164/82  08/01/18 136/86    Filed Weights   10/05/18 1556  Weight: 203 lb (92.1 kg)    Lab Results  Component Value Date   CHOL 243 (H) 09/19/2018   HDL 38 (L) 09/19/2018   LDLCALC 174 (H) 09/19/2018   TRIG 154 (H) 09/19/2018   CHOLHDL 6.4 (H) 09/19/2018    Hepatic Function Latest Ref Rng & Units 09/19/2018  Total Protein 6.0 - 8.5 g/dL 6.8  Albumin 4.0 - 5.0 g/dL 4.7  AST 0 - 40 IU/L 28  ALT 0 -  44 IU/L 19  Alk Phosphatase 39 - 117 IU/L 112  Total Bilirubin 0.0 - 1.2 mg/dL 0.7     Depression screen [facility] 2/9 10/05/2018 09/19/2018 08/01/2018  Decreased Interest 0 0 0  Down, Depressed, Hopeless 0 0 0  PHQ - 2 Score 0 0 0  Altered sleeping 0 0 0  Tired, decreased energy 0 0 0  Change in appetite 0 0 0  Feeling bad or failure about yourself  0 0 0  Trouble concentrating 0 0 0  Moving slowly or fidgety/restless 0 0 0  Suicidal thoughts 0 0 0  PHQ-9 Score 0 0 0  Difficult doing work/chores Not difficult at all Not difficult at all -    No flowsheet data found.   Recent Results (from the past 2160 hour(s))  Comp Met (CMET)     Status:  None   Collection Time: 09/19/18  8:52 AM  Result Value Ref Range   Glucose 98 65 - 99 mg/dL   BUN 17 6 - 24 mg/dL   Creatinine, Ser 1.18 0.76 - 1.27 mg/dL   GFR calc non Af Amer 72 >59 mL/min/1.73   GFR calc Af Amer 83 >59 mL/min/1.73   BUN/Creatinine Ratio 14 9 - 20   Sodium 143 134 - 144 mmol/L   Potassium 4.8 3.5 - 5.2 mmol/L   Chloride 103 96 - 106 mmol/L   CO2 25 20 - 29 mmol/L   Calcium 9.8 8.7 - 10.2 mg/dL   Total Protein 6.8 6.0 - 8.5 g/dL   Albumin 4.7 4.0 - 5.0 g/dL   Globulin, Total 2.1 1.5 - 4.5 g/dL   Albumin/Globulin Ratio 2.2 1.2 - 2.2   Bilirubin Total 0.7 0.0 - 1.2 mg/dL   Alkaline Phosphatase 112 39 - 117 IU/L   AST 28 0 - 40 IU/L   ALT 19 0 - 44 IU/L  Lipid Profile     Status: Abnormal   Collection Time: 09/19/18  8:52 AM  Result Value Ref Range   Cholesterol, Total 243 (H) 100 - 199 mg/dL   Triglycerides 154 (H) 0 - 149 mg/dL   HDL 38 (L) >39 mg/dL   VLDL Cholesterol Cal 31 5 - 40 mg/dL   LDL Calculated 174 (H) 0 - 99 mg/dL    Comment: **Effective September 25, 2018, [facility] is implementing an improved** equation to calculate Low Density Lipoprotein Cholesterol (LDL-C) concentrations, to be used in all lipid panels that report calculated LDL-C. This equation was developed through a collaboration with the [employer], Lung and [employer] (NIH).[1] The NIH calculation overcomes the limitations of the existing Friedewald LDL-C equation and performs equally well in both fasting and non-fasting individuals. 1. Pauline Good Q, et al. A new equation for calculation of low-density lipoprotein cholesterol in patients with normolipidemia and/or hypertriglyceridemia. JAMA Cardiol. 2020 Feb 26. doi:10.1001/jamacardio.2020.0013    Chol/HDL Ratio 6.4 (H) 0.0 - 5.0 ratio    Comment:                                   T. Chol/HDL Ratio                                             Men  Women  1/2 Avg.Risk  3.4     3.3                                   Avg.Risk  5.0    4.4                                2X Avg.Risk  9.6    7.1                                3X Avg.Risk 23.4   11.0   HgB A1c     Status: None   Collection Time: 09/19/18  8:52 AM  Result Value Ref Range   Hgb A1c MFr Bld 5.4 4.8 - 5.6 %    Comment:          Prediabetes: 5.7 - 6.4          Diabetes: >6.4          Glycemic control for adults with diabetes: <7.0    Est. average glucose Bld gHb Est-mCnc 108 mg/dL  T4, free     Status: None   Collection Time: 09/19/18  8:52 AM  Result Value Ref Range   Free T4 1.26 0.82 - 1.77 ng/dL  T3     Status: None   Collection Time: 09/19/18  8:52 AM  Result Value Ref Range   T3, Total 101 71 - 180 ng/dL  HIV antibody (with reflex)     Status: None   Collection Time: 09/19/18  8:52 AM  Result Value Ref Range   HIV Screen 4th Generation wRfx Non Reactive Non Reactive  Hepatitis C Antibody     Status: None   Collection Time: 09/19/18  8:52 AM  Result Value Ref Range   Hep C Virus Ab <0.1 0.0 - 0.9 s/co ratio    Comment:                                   Negative:     < 0.8                              Indeterminate: 0.8 - 0.9                                   Positive:     > 0.9  The CDC recommends that a positive HCV antibody result  be followed up with a HCV Nucleic Acid Amplification  test (975883).   POC Hemoccult Bld/Stl (1-Cd Office Dx)     Status: Normal   Collection Time: 09/19/18  8:54 AM  Result Value Ref Range   Card #1 Date 09/19/2018    Fecal Occult Blood, POC Negative Negative     Impression and Recommendations:    1. Hyperlipidemia, unspecified hyperlipidemia type   2. Elevated blood pressure reading without diagnosis of hypertension   3. Family history of stroke or transient ischemic attack in father   52. Family history of hyperlipidemia- Mom and Dad     - Reviewed recent lab work (09/19/2018) in depth with patient today.  All lab work within normal  limits unless  otherwise noted.  Extensive education provided and all questions answered.  Elevated Blood Pressure - Reviewed that patient's BP was elevated last visit during his physical. - Discussed goal BP of under 130/80.  - Explained that the only way we can establish patient's baseline is through checking BP at home. - Ambulatory blood pressure monitoring STRONGLY encouraged. - Advised patient to purchase an upper arm blood pressure cuff. - Reviewed prudent BP and heart rate monitoring habits. - Told pt to keep a BP + HR log and call in a month to let us know what home data have been.  - Will continue to monitor.  Hyperlipidemia - Recent lipid panel with abnormal results. - LDL = 174, elevated. Discussed goal LDL of less than 100. - HDL = 38, low, down from 43 prior (11 months ago). Discussed goal HDL of over 40. - Triglycerides = 154, mildly elevated from 136 prior (11 months ago).  The 10-year ASCVD risk score Mikey Bussing DC Brooke Bonito., et al., 2013) is: 9.2%   Values used to calculate the score:     Age: 58 years     Sex: Male     Is Non-Hispanic African American: No     Diabetic: No     Tobacco smoker: No     Systolic Blood Pressure: 295 mmHg     Is BP treated: No     HDL Cholesterol: 38 mg/dL     Total Cholesterol: 243 mg/dL  - Discussed that given patient's current cholesterol and blood pressure levels, he meets criteria to begin cholesterol medications.  - Advised patient to visit the American Heart Association for further information about lifestyle changes to lower cholesterol.  Advised plant-based heart healthy diet, or chicken, [place], fish, tofu, spinach, etc.  Encouraged patient to lower consumption of cheese, dairy, and red meat (beef, pork).  - Dietary changes such as low saturated & trans fat and low carb diets discussed with patient.  Encouraged regular moderate-to-high intensity cardiovascular exercise and weight loss when appropriate.   - Discussed risks & benefits of statins.   Reviewed that S-E of statins are exacerbated by lack of hydration and lack of exercise/physical activity to improve blood flow to the muscles.  - Extensive education was provided and all questions were answered.  Lifestyle & Preventative Health Maintenance - Advised patient to continue daily exercise to improve overall mental, physical, and emotional health.    - Reviewed the "spokes of the wheel" of mood and health management.  Stressed the importance of ongoing prudent habits, including regular exercise, appropriate sleep hygiene, healthful dietary habits, and prayer/meditation to relax.  - Encouraged patient to engage in daily physical activity, especially a formal exercise routine.  Recommended that the patient eventually strive for at least 150 minutes of moderate cardiovascular activity per week according to guidelines established by the [facility].   - Healthy dietary habits encouraged, including low-carb, and high amounts of lean protein in diet.   - Patient should also consume adequate amounts of water.  - Health counseling performed.  All questions answered.  Recommendations - Patient agrees to call with BP log in 4-6 weeks. - Re-check cholesterol in four months after dietary changes. - Pt will return for OV 3-4 days after cholesterol re-check.  - As part of my medical decision making, I reviewed the following data within the [place] History obtained from pt /family, CMA notes reviewed and incorporated if applicable, Labs reviewed, Radiograph/ tests reviewed if applicable and OV  notes from prior OV's with me, as well as other specialists she/he has seen since seeing me last, were all reviewed and used in my medical decision making process today.   - Additionally, discussion had with patient regarding txmnt plan, their biases about that plan etc were used in my medical decision making today.   - The patient agreed with the plan and demonstrated an understanding of the  instructions.   No barriers to understanding were identified.   - Red flag symptoms and signs discussed in detail.  Patient expressed understanding regarding what to do in case of emergency\ urgent symptoms.  The patient was advised to call back or seek an in-person evaluation if the symptoms worsen or if the condition fails to improve as anticipated.   Return for 6wks weeks f/up call for home BP's/HR, 35moFLP then ov 3 d later.    No orders of the defined types were placed in this encounter.   No orders of the defined types were placed in this encounter.   There are no discontinued medications.    I provided 25+ minutes of face-to-face time during this encounter.  Additional time was spent with charting and coordination of care after the actual visit commenced.   Note:  This note was prepared with assistance of Dragon voice recognition software. Occasional wrong-word or sound-a-like substitutions may have occurred due to the inherent limitations of voice recognition software.  This document serves as a record of services personally performed by DMellody Dance DO. It was created on her behalf by KToni Amend a trained medical scribe. The creation of this record is based on the scribe's personal observations and the provider's statements to them.   I have reviewed the above medical documentation for accuracy and completeness and I concur.  DMellody Dance DO 10/05/2018 5:24 PM        Patient Care Team    Relationship Specialty Notifications Start End  OMellody Dance DO PCP - General Family Medicine  08/01/18   HEunice Blase MD  Family Medicine  08/01/18   BMcarthur Rossetti MD Consulting Physician Orthopedic Surgery  08/01/18    Comment: L hip  Dasher, DRayvon Char MD  Dermatology  09/19/18     -Vitals obtained; medications/ allergies reconciled;  personal medical, social, Sx etc.histories were updated by CMA, reviewed by me and are reflected in chart  Patient  Active Problem List   Diagnosis Date Noted   Bradycardia, unspecified 08/01/2018    Priority: High   Fluttering sensation of heart- with rest and lasts 2-3 sec 08/01/2018    Priority: High   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 08/01/2018    Priority: High   Primary osteoarthritis of left hip 05/16/2018    Priority: Medium   Elevated blood pressure reading without diagnosis of hypertension 10/05/2018   Hyperlipidemia 10/05/2018   Family history of stroke or transient ischemic attack in father 08/01/2018   Family history of hyperlipidemia- Mom and Dad 08/01/2018     No outpatient medications have been marked as taking for the 10/05/18 encounter (Office Visit) with OMellody Dance DO.     No Known Allergies   ROS:  See above HPI for pertinent positives and negatives   Objective:   Height 6' (1.829 m), weight 203 lb (92.1 kg).  (if some vitals are omitted, this means that patient was UNABLE to obtain them even though they were asked to get them prior to OV today.  They were asked to call [place] their earliest convenience  with these once obtained.)  General: A & O * 3; visually in no acute distress; in usual state of health.  Skin: Visible skin appears normal and pt's usual skin color HEENT:  EOMI, head is normocephalic and atraumatic.  Sclera are anicteric. Neck has a good range of motion.  Lips are noncyanotic Chest: normal chest excursion and movement Respiratory: speaking in full sentences, no conversational dyspnea; no use of accessory muscles Psych: insight good, mood- appears full

## 2019-03-05 ENCOUNTER — Other Ambulatory Visit: Payer: Self-pay

## 2019-03-05 ENCOUNTER — Ambulatory Visit: Payer: Self-pay

## 2019-03-05 ENCOUNTER — Encounter: Payer: Self-pay | Admitting: Family Medicine

## 2019-03-05 ENCOUNTER — Ambulatory Visit (INDEPENDENT_AMBULATORY_CARE_PROVIDER_SITE_OTHER): Payer: 59 | Admitting: Family Medicine

## 2019-03-05 VITALS — Ht 72.0 in | Wt 200.0 lb

## 2019-03-05 DIAGNOSIS — M1612 Unilateral primary osteoarthritis, left hip: Secondary | ICD-10-CM | POA: Diagnosis not present

## 2019-03-05 NOTE — Progress Notes (Signed)
Office Visit Note   Patient: Randy Bell           Date of Birth: [birth year]           MRN: [id number] Visit Date: 03/05/2019 Requested by: Mellody Dance, DO [place],  [place] [address] PCP: Mellody Dance, DO  Subjective: Chief Complaint  Patient presents with  . Left Hip - Pain    Cortisone injections are not helping as much as they used to, but he also is not waiting as long to get them now.    HPI: He is here with recurrent left hip pain.  Injection last April helped take the edge off his pain.  He has not been pain-free, but has been able to do a lot of his activities.  He has contemplated hip replacement placement but is not quite sure if he is ready for it yet.  He likes to go barefoot waterskiing and he thinks he might not be able to do that after a hip replacement.  He also is still active playing softball and going skiing in the wintertime.  He is not taking medications on a daily basis.              ROS: No fevers or chills.  All other systems were reviewed and are negative.  Objective: Vital Signs: Ht 6' (1.829 m)   Wt 200 lb (90.7 kg)   BMI 27.12 kg/m   Physical Exam:  General:  Alert and oriented, in no acute distress. Pulm:  Breathing unlabored. Psy:  Normal mood, congruent affect. Skin: No rash. Left hip: He has stiffness with flexion and internal rotation, and a moderate amount of pain.  No tenderness over the greater trochanter.  Imaging: None today  Assessment & Plan: 1.  Left hip DJD -We discussed various options and elected to inject again with cortisone under ultrasound guidance.  Future options besides hip replacement would include trying to get insurance approval for gel injections; PRP; dextrose prolotherapy. -Gave him informational pamphlet on anterior approach hip replacement per Dr. Ninfa Linden.  Follow-up as needed.     Procedures: Ultrasound-guided left hip injection: After sterile prep with Betadine, injected 8 cc 1%  lidocaine without epinephrine and 40 mg methylprednisolone using a 22-gauge spinal needle, passing the needle through the iliofemoral ligament into the femoral head/neck junction.  Injectate seen filling the joint capsule.  Modest immediate improvement.     PMFS History: Patient Active Problem List   Diagnosis Date Noted  . Elevated blood pressure reading without diagnosis of hypertension 10/05/2018  . Hyperlipidemia 10/05/2018  . Family history of stroke or transient ischemic attack in father 08/01/2018  . Family history of hyperlipidemia- Mom and Dad 08/01/2018  . Bradycardia, unspecified 08/01/2018  . Fluttering sensation of heart- with rest and lasts 2-3 sec 08/01/2018  . 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 08/01/2018  . Primary osteoarthritis of left hip 05/16/2018   History reviewed. No pertinent past medical history.  Family History  Problem Relation Age of Onset  . Hyperlipidemia Mother   . Hyperlipidemia Father   . Stroke Father     History reviewed. No pertinent surgical history. Social History   Occupational History  . Not on file  Tobacco Use  . Smoking status: Former Smoker    Years: 3.00    Types: Cigarettes  . Smokeless tobacco: Never Used  . Tobacco comment: quit 30 years   Substance and Sexual Activity  . Alcohol use: Yes    Alcohol/week: 6.0  standard drinks    Types: 6 Standard drinks or equivalent per week  . Drug use: Never  . Sexual activity: Not on file

## 2019-03-26 ENCOUNTER — Other Ambulatory Visit: Payer: 59

## 2019-03-29 ENCOUNTER — Telehealth: Payer: Self-pay | Admitting: Family Medicine

## 2019-03-29 NOTE — Telephone Encounter (Signed)
Patient dropped off BP log. Please see scanned documents. AS, CMA

## 2019-03-29 NOTE — Telephone Encounter (Signed)
Error. AS, CMA ?

## 2019-04-02 ENCOUNTER — Telehealth: Payer: Self-pay | Admitting: Family Medicine

## 2019-04-02 DIAGNOSIS — E785 Hyperlipidemia, unspecified: Secondary | ICD-10-CM

## 2019-04-02 NOTE — Telephone Encounter (Signed)
Return for 6wks weeks f/up call for home BP's/HR, 25mo-FLP then ov 3 d later.

## 2019-04-02 NOTE — Telephone Encounter (Signed)
Patient called states he knows did not schedule OV back in Jan as advised but wants to do it now--- Scheduled pt for OV & FBW 3dys prior---  Forwarding note to med asst there are no lab orders in pt's chart for end of March appt.   --glh

## 2019-04-23 ENCOUNTER — Other Ambulatory Visit: Payer: 59

## 2019-04-23 ENCOUNTER — Other Ambulatory Visit: Payer: Self-pay

## 2019-04-23 DIAGNOSIS — Z83438 Family history of other disorder of lipoprotein metabolism and other lipidemia: Secondary | ICD-10-CM

## 2019-04-23 DIAGNOSIS — E785 Hyperlipidemia, unspecified: Secondary | ICD-10-CM

## 2019-04-23 NOTE — Progress Notes (Signed)
Return for 6wks weeks f/up call for home BP's/HR, 24mo-FLP then ov 3 d later.  per note from 10/05/18. Patient has apt with Dr. Jenetta Downer 04/26/19. AS, CMA

## 2019-04-24 LAB — LIPID PANEL
Chol/HDL Ratio: 6.2 ratio — ABNORMAL HIGH (ref 0.0–5.0)
Cholesterol, Total: 231 mg/dL — ABNORMAL HIGH (ref 100–199)
HDL: 37 mg/dL — ABNORMAL LOW (ref >39)
LDL Chol Calc (NIH): 163 mg/dL — ABNORMAL HIGH (ref 0–99)
Triglycerides: 166 mg/dL — ABNORMAL HIGH (ref 0–149)
VLDL Cholesterol Cal: 31 mg/dL (ref 5–40)

## 2019-04-26 ENCOUNTER — Other Ambulatory Visit: Payer: Self-pay

## 2019-04-26 ENCOUNTER — Ambulatory Visit (INDEPENDENT_AMBULATORY_CARE_PROVIDER_SITE_OTHER): Payer: 59 | Admitting: Family Medicine

## 2019-04-26 ENCOUNTER — Encounter: Payer: Self-pay | Admitting: Family Medicine

## 2019-04-26 VITALS — BP 122/70 | HR 50 | Temp 98.5°F | Ht 72.0 in | Wt 209.4 lb

## 2019-04-26 DIAGNOSIS — E785 Hyperlipidemia, unspecified: Secondary | ICD-10-CM | POA: Diagnosis not present

## 2019-04-26 DIAGNOSIS — R42 Dizziness and giddiness: Secondary | ICD-10-CM | POA: Insufficient documentation

## 2019-04-26 DIAGNOSIS — R030 Elevated blood-pressure reading, without diagnosis of hypertension: Secondary | ICD-10-CM | POA: Diagnosis not present

## 2019-04-26 DIAGNOSIS — R002 Palpitations: Secondary | ICD-10-CM

## 2019-04-26 DIAGNOSIS — Z823 Family history of stroke: Secondary | ICD-10-CM

## 2019-04-26 DIAGNOSIS — Z83438 Family history of other disorder of lipoprotein metabolism and other lipidemia: Secondary | ICD-10-CM

## 2019-04-26 DIAGNOSIS — R001 Bradycardia, unspecified: Secondary | ICD-10-CM | POA: Diagnosis not present

## 2019-04-26 MED ORDER — ROSUVASTATIN CALCIUM 5 MG PO TABS: ORAL_TABLET | ORAL | 1 refills | Status: DC

## 2019-04-26 NOTE — Progress Notes (Signed)
Impression and Recommendations:    1. Episodic lightheadedness with exertion   2. Bradycardia, unspecified   3. Hyperlipidemia, unspecified hyperlipidemia type   4. Elevated blood pressure reading without diagnosis of hypertension   5. Family history of stroke or transient ischemic attack in father   30. Family history of hyperlipidemia- Mom and Dad   35. Fluttering sensation of heart-lasts a couple seconds only     Episodic lightheadedness w/ exertion, fluttering sensation of heart (lasts a couple seconds only) - Reviewed patient's symptoms at length and discussed referral to cardiology for further assessment. Pt tells me he and his wife prefer to get Cards eval at this time as he has never done so in past  (from [place] note from 07/2018:  At age 5 yo- got up to awaken from sleep to hit alarm clock and he passed right out- got lightheadedness.    NO LOC.  No memory loss.  Never happened again "until recently a little."   Just wanted to let me know.   This was last time he went to the doctors.     Rides bike 100 miles w/o difficulty but when I pick up daughjter and put her in bed and go to stand up too quick - feels dizzy- woozy/ lightheaded,  no vertigo.  No focal neurological symptoms.  No associated heart palpitations, visual changes, headaches etc. does not know what his baseline heart rate and blood pressures are  - Advised patient to defer all management and screening to cardiology, and follow all specialist recommendations for stress tests, imaging, etc.  - Ambulatory referral to cardiology placed today.  See orders. - Patient knows to call front desk if he does not hear back about the referral within a reasonable time period.  - Will continue to monitor alongside specialist.   Elevated blood pressure reading without diagnosis of hypertension - Bradycardia - Stable at this time. - Per BP log, patient's BP is controlled at home. - Encouraged patient to keep an eye on his  blood pressure at home, at least once weekly.  - Will continue to monitor.   Hyperlipidemia - Family Hx of stroke or TIA in father, Family Hx HLD (Mom and Dad) - Last FLP obtained three days ago:  Triglycerides = 166, elevated, up from 154 seven months prior. HDL = 37, low, similar to 38 seven months prior. LDL = 163, elevated, down from 174 seven months prior.  The 10-year ASCVD risk score Mikey Bussing DC Brooke Bonito., et al., 2013) is: 5.3%  - Discussed that patient's cholesterol levels are not at goal and with his Fam Hx- I rec low dose statin - Recommended beginning low-dose statin management at this time.  - Begin half-tablet of Crestor 5 mg (2.5 mg) on M,W,F.  See med list. - To help reduce incidence of S-E on statins, advised consuming adequate amounts of water and engage in regular exercise.  - Strongly encouraged prudent dietary changes such as low saturated & trans fat diets to help control hyperlipidemia, and low carb diets to help control hypertriglyceridemia.  - To help improve HDL, encouraged patient to follow AHA guidelines for regular exercise and also engage in weight loss if BMI above 25.   - We will continue to monitor.   Recommendations - Re-check FLP and ALT in 2 months after starting and taking 2.5 mg Crestor on M,W,F. - Otherwise, follow up in 4-6 months or sooner if issues.    Orders Placed This Encounter  Procedures  . Ambulatory referral to Cardiology    Meds ordered this encounter  Medications  . rosuvastatin (CRESTOR) 5 MG tablet    Sig: 0.5 tab q hs Mon, Wed, Fri nights only    Dispense:  36 tablet    Refill:  1    Please see AVS handed out to patient at the end of our visit for further patient instructions/ counseling done pertaining to today's office visit.   Return sent to cards for fluttering and lightheaded, for re-check FLP and ALT in 2 mo after 2.5 mg Crestor M,W,F; f/up 4-6 mo otherwise, sooner if issues.     Note:  This note was prepared with  assistance of Dragon voice recognition software. Occasional wrong-word or sound-a-like substitutions may have occurred due to the inherent limitations of voice recognition software.   The [place] was signed into law in 2016 which includes the topic of electronic health records.  This provides immediate access to information in MyChart.  This includes consultation notes, operative notes, office notes, lab results and pathology reports.  If you have any questions about what you read please let us know at your next visit or call us at the office.  We are right here with you.    This case required medical decision making of at least moderate complexity.  This document serves as a record of services personally performed by Mellody Dance, DO. It was created on her behalf by Toni Amend, a trained medical scribe. The creation of this record is based on the scribe's personal observations and the provider's statements to them.    The above documentation from Toni Amend, medical scribe, has been reviewed by Marjory Sneddon, D.O.  --------------------------------------------------------------------------------------------------------------------------------------------------------------------------------------------------------------------------------------------  Subjective:     Phillips Odor, am serving as scribe for Dr.Wendolyn Raso.   HPI: Rufino Staup is a 49 y.o. male who presents to [place] at [facility] today for issues as discussed below.  States that overall he is doing well today.  - Dizziness, Heart Racing, Intermittently- only OCC and with different Exertion Notes that he still cycles regularly; "I just love it."  He doesn't have trouble while cycling.    However, says when he hikes, walks, and "god forbid, runs," he experiences strange symptoms.  Notes "I don't know if it has to do with my heart rate going faster or what, but I  can feel like I'm really dang close to blacking out."  Says "I ride all the time and I do not ever have the issue on my bike."  Reports that he gets on his bike, takes off, and rides hard, and never experiences the symptoms that alarm him.  However, notes he often goes on a walk that is about a quarter mile of length out from his house, and all uphill.  Says "that first 300 yards from my house is uphill, at a pretty good grade," and "there's times when I get to the top of that hill," where he experiences symptoms that concern him.    Reports dizziness, heart racing, lightheadedness, and a strange feeling, like he is going to pass out.  "It's just a strange feeling, a feeling I don't like."  He really started noticing these symptoms recently while walking up this hill.  However, his symptoms of concern do not always happen.  Notes "last night [after walking up the hill] I was absolutely fine and did not have that feeling.  - says he "cycles hard"  and "never has the sx while doing that activity"  Says when the symptoms occur, "I don't know what's going on, but I do try to reach and feel if the heart's racing."  Says "it feels like my heart's racing, and like if something doesn't settle down here, I'm about to fall out here".  But it goes away on it's own w/in less than a minute.  When the sensation occurs, he just keeps walking, and it goes away.  Notes "I've had the issue [for a long while] and it's not going away."     Feels he has these episodes at least once per week lately.   It's sometimes happened twice in a row, and sometimes happened not at all.  He feels this has been occurring for 10 years all told, but that his symptoms have recently worsened.  States in the past, it was "not like it has been now."  His first episode of these symptoms occurred on his 58th birthday, when he passed out after walking across the room to turn off the alarm clock.  Says at that time, he "hit the floor and stood  right back up."  He went to the doctor after this for a check-up.  Notes this was the first time he'd been to the doctor in 20 years.  Says this was the first time he had his cholesterol checked, and it was "borderline grey area then."  He was placed on Niacin for three months at that time and quit taking it after doing his own research on Niacin.  At that time, when he was cycling, he wore a heart rate monitor while exercising, and "was seeing some really high spikes."  The patient does not believe he has seen a cardiologist in the past.  He has not passed out recently.  Notes he "just feels lightheaded."  At night, when he gets out of the recliner, sometimes he gets that head rush feeling.  - Blood pressures at home Measurements obtained starting in September: 132/76 136/62 143/83 124/70 129/81 142/78 128/68  With pulses in the 50's.  Says regarding the blood pressure monitoring, "I wasn't as habitual as I wanted to be, and I haven't done it lately; I probably haven't done it since January or February."  Says that the data collection was "not very entertaining to me," but he collected it regardless.  Overall states "I was okay with [the blood pressures[."  He tried to figure out if his BP was lower or higher depending on the time of day, and "I could not put a finger on any correlation."  Feels that the closer he is time-wise to work, the worse his BP is, and if he's been at home with the kids, playing video games, relaxed, etc., his blood pressure is better-controlled.  HPI:  Hyperlipidemia:  49 y.o. male here for cholesterol follow-up.   Patient says that to control his cholesterol, they "tried to do the diet thing" since last OV, but it "only lasted three weeks tops."  He's continued eating burgers, steaks, etc., noting especially in the last few weeks.  - He denies new onset of: myalgias, arthralgias, increased fatigue more than normal, chest pains, exercise intolerance,  shortness of breath, dizziness, visual changes, headache, lower extremity swelling or claudication.   Most recent cholesterol panel was:  Lab Results  Component Value Date   CHOL 231 (H) 04/23/2019   HDL 37 (L) 04/23/2019   LDLCALC 163 (H) 04/23/2019   TRIG 166 (H)  04/23/2019   CHOLHDL 6.2 (H) 04/23/2019   Hepatic Function Latest Ref Rng & Units 09/19/2018  Total Protein 6.0 - 8.5 g/dL 6.8  Albumin 4.0 - 5.0 g/dL 4.7  AST 0 - 40 IU/L 28  ALT 0 - 44 IU/L 19  Alk Phosphatase 39 - 117 IU/L 112  Total Bilirubin 0.0 - 1.2 mg/dL 0.7      Wt Readings from Last 3 Encounters:  04/26/19 209 lb 6.4 oz (95 kg)  03/05/19 200 lb (90.7 kg)  10/05/18 203 lb (92.1 kg)   BP Readings from Last 3 Encounters:  04/26/19 122/70  09/19/18 (!) 164/82  08/01/18 136/86   Pulse Readings from Last 3 Encounters:  04/26/19 (!) 50  09/19/18 (!) 53  08/01/18 (!) 56   BMI Readings from Last 3 Encounters:  04/26/19 28.40 kg/m  03/05/19 27.12 kg/m  10/05/18 27.53 kg/m     Patient Care Team    Relationship Specialty Notifications Start End  Mellody Dance, DO PCP - General Family Medicine  08/01/18   Eunice Blase, MD  Family Medicine  08/01/18   Mcarthur Rossetti, MD Consulting Physician Orthopedic Surgery  08/01/18    Comment: L hip  Dasher, Rayvon Char, MD  Dermatology  09/19/18      Patient Active Problem List   Diagnosis Date Noted  . Bradycardia, unspecified 08/01/2018  . Fluttering sensation of heart- with rest and lasts 2-3 sec 08/01/2018  . 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 08/01/2018  . Primary osteoarthritis of left hip 05/16/2018  . Episodic lightheadedness 04/26/2019  . Elevated blood pressure reading without diagnosis of hypertension 10/05/2018  . Hyperlipidemia 10/05/2018  . Family history of stroke or transient ischemic attack in father 08/01/2018  . Family history of hyperlipidemia- Mom and Dad 08/01/2018    Past Medical history, Surgical history, Family history,  Social history, Allergies and Medications have been entered into the medical record, reviewed and changed as needed.    No outpatient medications have been marked as taking for the 04/26/19 encounter (Office Visit) with Mellody Dance, DO.    Allergies:  No Known Allergies   Review of Systems:  A fourteen system review of systems was performed and found to be positive as per HPI.   Objective:   Blood pressure 122/70, pulse (!) 50, temperature 98.5 F (36.9 C), temperature source Oral, height 6' (1.829 m), weight 209 lb 6.4 oz (95 kg), SpO2 99 %. Body mass index is 28.4 kg/m. General:  Well Developed, well nourished, appropriate for stated age.  Neuro:  Alert and oriented,  extra-ocular muscles intact  HEENT:  Normocephalic, atraumatic, neck supple, no carotid bruits appreciated  Skin:  no gross rash, warm, pink. Cardiac:  RRR, S1 S2 Respiratory:  ECTA B/L and A/P, Not using accessory muscles, speaking in full sentences- unlabored. Vascular:  Ext warm, no cyanosis apprec.; cap RF less 2 sec. Psych:  No HI/SI, judgement and insight good, Euthymic mood. Full Affect.

## 2019-04-26 NOTE — Patient Instructions (Signed)
Please check your blood pressure once weekly and continue to keep track in a log as established.

## 2019-05-17 ENCOUNTER — Encounter: Payer: Self-pay | Admitting: Internal Medicine

## 2019-05-17 ENCOUNTER — Other Ambulatory Visit: Payer: Self-pay

## 2019-05-17 ENCOUNTER — Ambulatory Visit (INDEPENDENT_AMBULATORY_CARE_PROVIDER_SITE_OTHER): Payer: 59 | Admitting: Internal Medicine

## 2019-05-17 VITALS — BP 120/100 | HR 58 | Ht 72.0 in | Wt 209.0 lb

## 2019-05-17 DIAGNOSIS — R42 Dizziness and giddiness: Secondary | ICD-10-CM | POA: Diagnosis not present

## 2019-05-17 DIAGNOSIS — I208 Other forms of angina pectoris: Secondary | ICD-10-CM | POA: Diagnosis not present

## 2019-05-17 DIAGNOSIS — R6889 Other general symptoms and signs: Secondary | ICD-10-CM

## 2019-05-17 DIAGNOSIS — R0989 Other specified symptoms and signs involving the circulatory and respiratory systems: Secondary | ICD-10-CM

## 2019-05-17 NOTE — Progress Notes (Signed)
Cardiology Office Note:    Date:  05/17/2019   ID:  Randy Bell, DOB 1970-08-19, MRN [id number]  PCP:  Lorrene Reid, PA-C  Cardiologist:  No primary care provider on file.  Electrophysiologist:  None   Referring MD: Mellody Dance, DO   Chief Complaint: dizziness, pressure in throat  History of Present Illness:    Randy Bell is a 49 y.o. male with a history of hyperlipidemia and no past cardiovascular history who presents for occasional dizziness and pressure in throat.  He notes occasional dizzy. Get up from recliner and dizzy by time gets to kitchen.  On his 62th birthday he was getting up out of bed and says he passed out briefly but "popped back up immediately".  He bikes frequently and notes a peak heart rate of 170 to 180 bpm without symptoms.  However when he carries his young daughter 53 steps up the stairs he feels quite dizzy going up the stairs and feels that he has to stop.  He also gets dizziness walking up a somewhat easy he will outside of his house walking with his family.  He notes symptoms will occur on average once per month, and is confused as to why he is so symptomatic with simple activities when he is able to bike quite avidly without difficulty.  He also notes a sensation of pressure in his throat at times.  Family history significant for strokes and possible carotid stents in his father, father had hyperlipidemia and strokes in his early 44s.  His mother also has hyperlipidemia.  He has mild hyperlipidemia and has been started on Crestor 5 mg daily but has not started the prescription yet.  He is also found to have mildly elevated blood pressure.  No past medical history on file.  No past surgical history on file.  Current Medications: Current Meds  Medication Sig  . rosuvastatin (CRESTOR) 5 MG tablet 0.5 tab q hs Mon, Wed, Fri nights only     Allergies:   Patient has no known allergies.   Social History   Socioeconomic History  . Marital  status: Married    Spouse name: Not on file  . Number of children: Not on file  . Years of education: Not on file  . Highest education level: Not on file  Occupational History  . Not on file  Tobacco Use  . Smoking status: Former Smoker    Years: 3.00    Types: Cigarettes  . Smokeless tobacco: Never Used  . Tobacco comment: quit 30 years   Substance and Sexual Activity  . Alcohol use: Yes    Alcohol/week: 6.0 standard drinks    Types: 6 Standard drinks or equivalent per week  . Drug use: Never  . Sexual activity: Not on file  Other Topics Concern  . Not on file  Social History Narrative  . Not on file   Social Determinants of Health   Financial Resource Strain:   . Difficulty of Paying Living Expenses:   Food Insecurity:   . Worried About Charity fundraiser in the Last Year:   . Arboriculturist in the Last Year:   Transportation Needs:   . Film/video editor (Medical):   Marland Kitchen Lack of Transportation (Non-Medical):   Physical Activity:   . Days of Exercise per Week:   . Minutes of Exercise per Session:   Stress:   . Feeling of Stress :   Social Connections:   . Frequency of Communication with Friends  and Family:   . Frequency of Social Gatherings with Friends and Family:   . Attends Religious Services:   . Active Member of Clubs or Organizations:   . Attends Archivist Meetings:   Marland Kitchen Marital Status:      Family History: The patient's family history includes Hyperlipidemia in his father and mother; Stroke in his father.  ROS:   Please see the history of present illness.    All other systems reviewed and are negative.  EKGs/Labs/Other Studies Reviewed:    The following studies were reviewed today:  EKG: Sinus bradycardia, first-degree AV block, possible left atrial enlargement pattern   Recent Labs: 09/19/2018: ALT 19; BUN 17; Creatinine, Ser 1.18; Potassium 4.8; Sodium 143  Recent Lipid Panel    Component Value Date/Time   CHOL 231 (H)  04/23/2019 0824   TRIG 166 (H) 04/23/2019 0824   HDL 37 (L) 04/23/2019 0824   CHOLHDL 6.2 (H) 04/23/2019 0824   LDLCALC 163 (H) 04/23/2019 0824    Physical Exam:    VS:  BP (!) 120/100   Pulse (!) 58   Ht 6' (1.829 m)   Wt 209 lb (94.8 kg)   SpO2 97%   BMI 28.35 kg/m     Wt Readings from Last 5 Encounters:  05/17/19 209 lb (94.8 kg)  04/26/19 209 lb 6.4 oz (95 kg)  03/05/19 200 lb (90.7 kg)  10/05/18 203 lb (92.1 kg)  09/19/18 201 lb (91.2 kg)     Constitutional: No acute distress Eyes: sclera non-icteric, normal conjunctiva and lids ENMT: normal dentition, moist mucous membranes Cardiovascular: regular rhythm, normal rate, no murmurs. S1 and S2 normal. Radial pulses normal bilaterally. No jugular venous distention.   Dynamic maneuvers performed including auscultation with squat to stand and Valsalva.  No dynamic systolic murmur is appreciated.  Respiratory: clear to auscultation bilaterally GI : normal bowel sounds, soft and nontender. No distention.   MSK: extremities warm, well perfused. No edema.  NEURO: grossly nonfocal exam, moves all extremities. PSYCH: alert and oriented x 3, normal mood and affect.   ASSESSMENT:    1. Dizziness   2. Lightheadedness    PLAN:    Dizziness - Plan: EKG 12-Lead, ECHOCARDIOGRAM COMPLETE, EXERCISE TOLERANCE TEST (ETT)  Lightheadedness - Plan: EKG 12-Lead, ECHOCARDIOGRAM COMPLETE, EXERCISE TOLERANCE TEST (ETT)  Throat tightness - Plan: EXERCISE TOLERANCE TEST (ETT)  Anginal equivalent (No Name) - Plan: EXERCISE TOLERANCE TEST (ETT)  He has a concerning history of dizziness and lightheadedness with certain activities, and we will need to exclude LVOT obstruction.  We will evaluate for hypertrophic cardiomyopathy with an echocardiogram.  I am also concerned about his dizziness and lightheadedness with activity.  He has a normal EKG and we will perform an exercise treadmill test to evaluate for ischemia, hypotension, and ability to  reproduce symptoms.  Have explained this in detail to the patient, he demonstrates understanding of the indications for the testing and agrees to proceed.  We will follow-up afterward.  For hyperlipidemia would recommend initiating Crestor 5 mg daily as per directed by his primary care physician.  Cherlynn Kaiser, MD [place]  [facility]    Medication Adjustments/Labs and Tests Ordered: Current medicines are reviewed at length with the patient today.  Concerns regarding medicines are outlined above.  Orders Placed This Encounter  Procedures  . EXERCISE TOLERANCE TEST (ETT)  . EKG 12-Lead  . ECHOCARDIOGRAM COMPLETE   No orders of the defined types were placed in this encounter.  Patient Instructions  Medication Instructions:  The current medical regimen is effective;  continue present plan and medications as directed. Please refer to the Current Medication list given to you today.  *If you need a refill on your cardiac medications before your next appointment, please call your pharmacy*  Testing/Procedures: Your physician has requested that you have an exercise tolerance test, this is a screening tool to track your fitness level. This test evaluates the your exercise capacity by measuring cardiovascular response to exercise, the stress response is induced by exercise (exercise-treadmill).  Graded exercise test is also known as maximal exercise test or stress EKG test. Please also follow instruction sheet given.  >>>  WILL NEED COVID TESTING BEFORE THIS TEST.  Echocardiogram - Your physician has requested that you have an echocardiogram. Echocardiography is a painless test that uses sound waves to create images of your heart. It provides your doctor with information about the size and shape of your heart and how well your heart's chambers and valves are working. This procedure takes approximately one hour. There are no restrictions for this procedure. This will be performed at  our [facility] location - [address].  Follow-Up: Your next appointment:  AFTER TESTING  In Person with Cherlynn Kaiser, MD.  At [facility], you and your health needs are our priority.  As part of our continuing mission to provide you with exceptional heart care, we have created designated Provider Care Teams.  These Care Teams include your primary Cardiologist (physician) and Advanced Practice Providers (APPs -  Physician Assistants and Nurse Practitioners) who all work together to provide you with the care you need, when you need it.  We recommend signing up for the patient portal called "MyChart".  Sign up information is provided on this After Visit Summary.  MyChart is used to connect with patients for Virtual Visits (Telemedicine).  Patients are able to view lab/test results, encounter notes, upcoming appointments, etc.  Non-urgent messages can be sent to your provider as well.   To learn more about what you can do with MyChart, go to [email or web address].

## 2019-05-17 NOTE — Patient Instructions (Addendum)
Medication Instructions:  The current medical regimen is effective;  continue present plan and medications as directed. Please refer to the Current Medication list given to you today.  *If you need a refill on your cardiac medications before your next appointment, please call your pharmacy*  Testing/Procedures: Your physician has requested that you have an exercise tolerance test, this is a screening tool to track your fitness level. This test evaluates the your exercise capacity by measuring cardiovascular response to exercise, the stress response is induced by exercise (exercise-treadmill).  Graded exercise test is also known as maximal exercise test or stress EKG test. Please also follow instruction sheet given.  >>>  WILL NEED COVID TESTING BEFORE THIS TEST.  Echocardiogram - Your physician has requested that you have an echocardiogram. Echocardiography is a painless test that uses sound waves to create images of your heart. It provides your doctor with information about the size and shape of your heart and how well your heart's chambers and valves are working. This procedure takes approximately one hour. There are no restrictions for this procedure. This will be performed at our [facility] location - [address], [address].  Follow-Up: Your next appointment:  AFTER TESTING  In Person with Cherlynn Kaiser, MD.  At [facility], you and your health needs are our priority.  As part of our continuing mission to provide you with exceptional heart care, we have created designated Provider Care Teams.  These Care Teams include your primary Cardiologist (physician) and Advanced Practice Providers (APPs -  Physician Assistants and Nurse Practitioners) who all work together to provide you with the care you need, when you need it.  We recommend signing up for the patient portal called "MyChart".  Sign up information is provided on this After Visit Summary.  MyChart is used to connect with patients  for Virtual Visits (Telemedicine).  Patients are able to view lab/test results, encounter notes, upcoming appointments, etc.  Non-urgent messages can be sent to your provider as well.   To learn more about what you can do with MyChart, go to [email or web address].

## 2019-06-01 ENCOUNTER — Ambulatory Visit (HOSPITAL_COMMUNITY): Payer: 59 | Attending: Cardiology

## 2019-06-01 ENCOUNTER — Other Ambulatory Visit: Payer: Self-pay

## 2019-06-01 DIAGNOSIS — R42 Dizziness and giddiness: Secondary | ICD-10-CM | POA: Diagnosis not present

## 2019-06-11 ENCOUNTER — Other Ambulatory Visit (HOSPITAL_COMMUNITY)
Admission: RE | Admit: 2019-06-11 | Discharge: 2019-06-11 | Disposition: A | Discharge status: Home / Self Care | Payer: 59 | Source: Ambulatory Visit | Attending: Internal Medicine | Admitting: Internal Medicine

## 2019-06-11 ENCOUNTER — Telehealth: Payer: Self-pay | Admitting: *Deleted

## 2019-06-11 DIAGNOSIS — Z20822 Contact with and (suspected) exposure to covid-19: Secondary | ICD-10-CM | POA: Diagnosis not present

## 2019-06-11 DIAGNOSIS — Z01812 Encounter for preprocedural laboratory examination: Secondary | ICD-10-CM | POA: Insufficient documentation

## 2019-06-11 NOTE — Telephone Encounter (Signed)
-----   Message from Elouise Munroe, MD sent at 06/01/2019  6:00 PM EDT ----- No findings to suggest left ventricular outflow tract obstruction or hypertrophic cardiomyopathy as a source of dizzy episodes. Normal pump function, mildly dilated left atrium, mildly increased LV wall thickness.

## 2019-06-11 NOTE — Telephone Encounter (Signed)
Called - voicemail -- per dpr - left detailed echo result  message on voicemail will f/u in details at 06/26/19 appt . Keep appt for  nuclear testing.

## 2019-06-12 ENCOUNTER — Telehealth (HOSPITAL_COMMUNITY): Payer: Self-pay

## 2019-06-12 LAB — SARS CORONAVIRUS 2 (TAT 6-24 HRS): SARS Coronavirus 2: NEGATIVE

## 2019-06-12 NOTE — Telephone Encounter (Signed)
Encounter complete. 

## 2019-06-14 ENCOUNTER — Ambulatory Visit (HOSPITAL_COMMUNITY)
Admission: RE | Admit: 2019-06-14 | Discharge: 2019-06-14 | Disposition: A | Discharge status: Home / Self Care | Payer: 59 | Source: Ambulatory Visit | Attending: Cardiology | Admitting: Cardiology

## 2019-06-14 ENCOUNTER — Other Ambulatory Visit: Payer: Self-pay

## 2019-06-14 DIAGNOSIS — I208 Other forms of angina pectoris: Secondary | ICD-10-CM | POA: Diagnosis not present

## 2019-06-14 DIAGNOSIS — R42 Dizziness and giddiness: Secondary | ICD-10-CM | POA: Diagnosis not present

## 2019-06-14 DIAGNOSIS — R6889 Other general symptoms and signs: Secondary | ICD-10-CM | POA: Diagnosis not present

## 2019-06-14 LAB — EXERCISE TOLERANCE TEST
Estimated workload: 14.6 METS
Exercise duration (min): 12 min
Exercise duration (sec): 40 s
MPHR: 170 {beats}/min
Peak HR: 164 {beats}/min
Percent HR: 97 %
Rest HR: 72 {beats}/min

## 2019-06-22 ENCOUNTER — Other Ambulatory Visit: Payer: Self-pay | Admitting: Physician Assistant

## 2019-06-22 DIAGNOSIS — R42 Dizziness and giddiness: Secondary | ICD-10-CM

## 2019-06-22 DIAGNOSIS — E785 Hyperlipidemia, unspecified: Secondary | ICD-10-CM

## 2019-06-26 ENCOUNTER — Other Ambulatory Visit: Payer: Self-pay

## 2019-06-26 ENCOUNTER — Ambulatory Visit (INDEPENDENT_AMBULATORY_CARE_PROVIDER_SITE_OTHER): Payer: 59 | Admitting: Internal Medicine

## 2019-06-26 ENCOUNTER — Encounter: Payer: Self-pay | Admitting: Internal Medicine

## 2019-06-26 VITALS — BP 144/92 | HR 58 | Ht 72.0 in | Wt 211.4 lb

## 2019-06-26 DIAGNOSIS — R030 Elevated blood-pressure reading, without diagnosis of hypertension: Secondary | ICD-10-CM

## 2019-06-26 DIAGNOSIS — R6889 Other general symptoms and signs: Secondary | ICD-10-CM | POA: Diagnosis not present

## 2019-06-26 DIAGNOSIS — E785 Hyperlipidemia, unspecified: Secondary | ICD-10-CM | POA: Diagnosis not present

## 2019-06-26 DIAGNOSIS — R42 Dizziness and giddiness: Secondary | ICD-10-CM | POA: Diagnosis not present

## 2019-06-26 DIAGNOSIS — R0989 Other specified symptoms and signs involving the circulatory and respiratory systems: Secondary | ICD-10-CM

## 2019-06-26 MED ORDER — HYDROCHLOROTHIAZIDE 12.5 MG PO CAPS: 12.5000 mg | ORAL_CAPSULE | Freq: Every day | ORAL | 3 refills | Status: DC

## 2019-06-26 NOTE — Patient Instructions (Signed)
Medication Instructions:  START hydrochlorothiazide (HCTZ) 12.5 mg daily  *If you need a refill on your cardiac medications before your next appointment, please call your pharmacy*  Follow-Up: At [facility], you and your health needs are our priority.  As part of our continuing mission to provide you with exceptional heart care, we have created designated Provider Care Teams.  These Care Teams include your primary Cardiologist (physician) and Advanced Practice Providers (APPs -  Physician Assistants and Nurse Practitioners) who all work together to provide you with the care you need, when you need it.  We recommend signing up for the patient portal called "MyChart".  Sign up information is provided on this After Visit Summary.  MyChart is used to connect with patients for Virtual Visits (Telemedicine).  Patients are able to view lab/test results, encounter notes, upcoming appointments, etc.  Non-urgent messages can be sent to your provider as well.   To learn more about what you can do with MyChart, go to [email or web address].    Your next appointment:    June 29th at 8:40 AM  The format for your next appointment:   Virtual Visit   Provider:   Cherlynn Kaiser, MD

## 2019-06-26 NOTE — Progress Notes (Signed)
Cardiology Office Note:    Date:  06/26/2019   ID:  Randy Bell, DOB [birth year], MRN [id number]  PCP:  Lorrene Reid, PA-C  Cardiologist:  No primary care provider on file.  Electrophysiologist:  None   Referring MD: Mellody Dance, DO   Chief Complaint: f/u exertional dizziness, test results  History of Present Illness:    Randy Bell is a 49 y.o. male with a history of HLD who presents for follow up of test results for syncope and exertional dizziness.  We reviewed ETT results which showed excellent exercise capacity and no ischemia. Normal BP response to exercise.   Reviewed echocardiogram which showed no evidence of LVOT obstruction or HCM to account for dizziness with activity or prior syncopal episode.  Recently started on crestor, tolerating well.  The patient denies chest pain, chest pressure, dyspnea at rest or with exertion, palpitations, PND, orthopnea, or leg swelling. Denies cough, fever, chills. Denies nausea, vomiting.   No past medical history on file.  No past surgical history on file.  Current Medications: Current Meds  Medication Sig  . rosuvastatin (CRESTOR) 5 MG tablet 0.5 tab q hs Mon, Wed, Fri nights only     Allergies:   Patient has no known allergies.   Social History   Socioeconomic History  . Marital status: Married    Spouse name: Not on file  . Number of children: Not on file  . Years of education: Not on file  . Highest education level: Not on file  Occupational History  . Not on file  Tobacco Use  . Smoking status: Former Smoker    Years: 3.00    Types: Cigarettes  . Smokeless tobacco: Never Used  . Tobacco comment: quit 30 years   Substance and Sexual Activity  . Alcohol use: Yes    Alcohol/week: 6.0 standard drinks    Types: 6 Standard drinks or equivalent per week  . Drug use: Never  . Sexual activity: Not on file  Other Topics Concern  . Not on file  Social History Narrative  . Not on file   Social Determinants of  Health   Financial Resource Strain:   . Difficulty of Paying Living Expenses:   Food Insecurity:   . Worried About Charity fundraiser in the Last Year:   . Arboriculturist in the Last Year:   Transportation Needs:   . Film/video editor (Medical):   Marland Kitchen Lack of Transportation (Non-Medical):   Physical Activity:   . Days of Exercise per Week:   . Minutes of Exercise per Session:   Stress:   . Feeling of Stress :   Social Connections:   . Frequency of Communication with Friends and Family:   . Frequency of Social Gatherings with Friends and Family:   . Attends Religious Services:   . Active Member of Clubs or Organizations:   . Attends Archivist Meetings:   Marland Kitchen Marital Status:      Family History: The patient's family history includes Hyperlipidemia in his father and mother; Stroke in his father.  ROS:   Please see the history of present illness.    All other systems reviewed and are negative.  EKGs/Labs/Other Studies Reviewed:    The following studies were reviewed today:  EKG:  n/a  Recent Labs: 09/19/2018: ALT 19; BUN 17; Creatinine, Ser 1.18; Potassium 4.8; Sodium 143  Recent Lipid Panel    Component Value Date/Time   CHOL 231 (H) 04/23/2019 [id number]  TRIG 166 (H) 04/23/2019 0824   HDL 37 (L) 04/23/2019 0824   CHOLHDL 6.2 (H) 04/23/2019 0824   LDLCALC 163 (H) 04/23/2019 [id number]    Physical Exam:    VS:  There were no vitals taken for this visit.    Wt Readings from Last 5 Encounters:  05/17/19 209 lb (94.8 kg)  04/26/19 209 lb 6.4 oz (95 kg)  03/05/19 200 lb (90.7 kg)  10/05/18 203 lb (92.1 kg)  09/19/18 201 lb (91.2 kg)     Constitutional: No acute distress Eyes: sclera non-icteric, normal conjunctiva and lids ENMT: mask in place Cardiovascular: regular rhythm, normal rate, no murmurs. S1 and S2 normal. Radial pulses normal bilaterally. No jugular venous distention.  Respiratory: clear to auscultation bilaterally GI : normal bowel sounds, soft  and nontender. No distention.   MSK: extremities warm, well perfused. No edema.  NEURO: grossly nonfocal exam, moves all extremities. PSYCH: alert and oriented x 3, normal mood and affect.   ASSESSMENT:    1. Dizziness   2. Lightheadedness   3. Throat tightness   4. Hyperlipidemia, unspecified hyperlipidemia type   5. Elevated BP without diagnosis of hypertension    PLAN:    Dizziness Lightheadedness Throat tightness -no ischemia on treadmill test, no LVOT obstruction to suggest etiology for symptoms.  - we discussed addition investigation, patient would like to observe at this time.   Hyperlipidemia, unspecified hyperlipidemia type - continue crestor  Elevated BP without diagnosis of hypertension - query whether symptoms could be related to elevated BP with activity, though reportedly normal on ETT. Mildly elevated BP today and somewhat historically per patient report. Will start HCTZ 12.5 mg daily and monitor for response.  Total time of encounter: 30 minutes total time of encounter, including 18 minutes spent in face-to-face patient care on the date of this encounter. This time includes coordination of care and counseling regarding above mentioned problem list. Remainder of non-face-to-face time involved reviewing chart documents/testing relevant to the patient encounter and documentation in the medical record. I have independently reviewed documentation from referring provider.   Cherlynn Kaiser, MD [place]  [facility]    Medication Adjustments/Labs and Tests Ordered: Current medicines are reviewed at length with the patient today.  Concerns regarding medicines are outlined above.  No orders of the defined types were placed in this encounter.  Meds ordered this encounter  Medications  . hydrochlorothiazide (MICROZIDE) 12.5 MG capsule    Sig: Take 1 capsule (12.5 mg total) by mouth daily.    Dispense:  30 capsule    Refill:  3    Patient Instructions    Medication Instructions:  START hydrochlorothiazide (HCTZ) 12.5 mg daily  *If you need a refill on your cardiac medications before your next appointment, please call your pharmacy*  Follow-Up: At [facility], you and your health needs are our priority.  As part of our continuing mission to provide you with exceptional heart care, we have created designated Provider Care Teams.  These Care Teams include your primary Cardiologist (physician) and Advanced Practice Providers (APPs -  Physician Assistants and Nurse Practitioners) who all work together to provide you with the care you need, when you need it.  We recommend signing up for the patient portal called "MyChart".  Sign up information is provided on this After Visit Summary.  MyChart is used to connect with patients for Virtual Visits (Telemedicine).  Patients are able to view lab/test results, encounter notes, upcoming appointments, etc.  Non-urgent messages can  be sent to your provider as well.   To learn more about what you can do with MyChart, go to [email or web address].    Your next appointment:    June 29th at 8:40 AM  The format for your next appointment:   Virtual Visit   Provider:   Cherlynn Kaiser, MD

## 2019-06-27 ENCOUNTER — Other Ambulatory Visit: Payer: 59

## 2019-06-27 DIAGNOSIS — R42 Dizziness and giddiness: Secondary | ICD-10-CM

## 2019-06-27 DIAGNOSIS — E785 Hyperlipidemia, unspecified: Secondary | ICD-10-CM

## 2019-06-28 LAB — LIPID PANEL
Chol/HDL Ratio: 6.3 ratio — ABNORMAL HIGH (ref 0.0–5.0)
Cholesterol, Total: 226 mg/dL — ABNORMAL HIGH (ref 100–199)
HDL: 36 mg/dL — ABNORMAL LOW (ref >39)
LDL Chol Calc (NIH): 148 mg/dL — ABNORMAL HIGH (ref 0–99)
Triglycerides: 227 mg/dL — ABNORMAL HIGH (ref 0–149)
VLDL Cholesterol Cal: 42 mg/dL — ABNORMAL HIGH (ref 5–40)

## 2019-06-28 LAB — ALT: ALT: 23 IU/L (ref 0–44)

## 2019-07-02 ENCOUNTER — Telehealth: Payer: Self-pay | Admitting: Physician Assistant

## 2019-07-02 NOTE — Telephone Encounter (Signed)
Patient returning [employer]'s call .  --glh

## 2019-07-04 NOTE — Telephone Encounter (Signed)
-----   Message from Mickel Crow, [place] sent at 07/04/2019 11:43 AM EDT ----- Patient has been made aware of lab results and is wondering if the ASCVD risk score was not calculated correctly. He states his BP has decrease and is no longer high which he states should lower this risk score. Patient is reluctant to increase medication unless absolutely necessary. Patient does long distance biking and is very active.   Please advise. AS, CMA

## 2019-07-05 ENCOUNTER — Other Ambulatory Visit: Payer: Self-pay | Admitting: Physician Assistant

## 2019-07-05 DIAGNOSIS — E785 Hyperlipidemia, unspecified: Secondary | ICD-10-CM

## 2019-07-24 ENCOUNTER — Encounter: Payer: Self-pay | Admitting: Internal Medicine

## 2019-07-24 ENCOUNTER — Telehealth: Payer: Self-pay | Admitting: *Deleted

## 2019-07-24 ENCOUNTER — Telehealth (INDEPENDENT_AMBULATORY_CARE_PROVIDER_SITE_OTHER): Payer: 59 | Admitting: Internal Medicine

## 2019-07-24 VITALS — Ht 72.0 in | Wt 210.0 lb

## 2019-07-24 DIAGNOSIS — E785 Hyperlipidemia, unspecified: Secondary | ICD-10-CM | POA: Diagnosis not present

## 2019-07-24 DIAGNOSIS — R030 Elevated blood-pressure reading, without diagnosis of hypertension: Secondary | ICD-10-CM

## 2019-07-24 DIAGNOSIS — R002 Palpitations: Secondary | ICD-10-CM

## 2019-07-24 DIAGNOSIS — R42 Dizziness and giddiness: Secondary | ICD-10-CM | POA: Diagnosis not present

## 2019-07-24 NOTE — Telephone Encounter (Signed)
°  Patient Consent for Virtual Visit         Randy Bell has provided verbal consent on 07/24/2019 for a virtual visit (video or telephone).   CONSENT FOR VIRTUAL VISIT FOR:  Randy Bell  By participating in this virtual visit I agree to the following:  I hereby voluntarily request, consent and authorize [facility] and its employed or contracted physicians, physician assistants, nurse practitioners or other licensed health care professionals (the Practitioner), to provide me with telemedicine health care services (the Services") as deemed necessary by the treating Practitioner. I acknowledge and consent to receive the Services by the Practitioner via telemedicine. I understand that the telemedicine visit will involve communicating with the Practitioner through live audiovisual communication technology and the disclosure of certain medical information by electronic transmission. I acknowledge that I have been given the opportunity to request an in-person assessment or other available alternative prior to the telemedicine visit and am voluntarily participating in the telemedicine visit.  I understand that I have the right to withhold or withdraw my consent to the use of telemedicine in the course of my care at any time, without affecting my right to future care or treatment, and that the Practitioner or I may terminate the telemedicine visit at any time. I understand that I have the right to inspect all information obtained and/or recorded in the course of the telemedicine visit and may receive copies of available information for a reasonable fee.  I understand that some of the potential risks of receiving the Services via telemedicine include:   Delay or interruption in medical evaluation due to technological equipment failure or disruption;  Information transmitted may not be sufficient (e.g. poor resolution of images) to allow for appropriate medical decision making by the Practitioner;  and/or   In rare instances, security protocols could fail, causing a breach of personal health information.  Furthermore, I acknowledge that it is my responsibility to provide information about my medical history, conditions and care that is complete and accurate to the best of my ability. I acknowledge that Practitioner's advice, recommendations, and/or decision may be based on factors not within their control, such as incomplete or inaccurate data provided by me or distortions of diagnostic images or specimens that may result from electronic transmissions. I understand that the practice of medicine is not an exact science and that Practitioner makes no warranties or guarantees regarding treatment outcomes. I acknowledge that a copy of this consent can be made available to me via my patient portal ([place]), or I can request a printed copy by calling the office of [facility].    I understand that my insurance will be billed for this visit.   I have read or had this consent read to me.  I understand the contents of this consent, which adequately explains the benefits and risks of the Services being provided via telemedicine.   I have been provided ample opportunity to ask questions regarding this consent and the Services and have had my questions answered to my satisfaction.  I give my informed consent for the services to be provided through the use of telemedicine in my medical care

## 2019-07-24 NOTE — Patient Instructions (Addendum)
Medication Instructions:  Continue HCTZ 12.5 mg daily  *If you need a refill on your cardiac medications before your next appointment, please call your pharmacy*   Lab Work: Not needed    Testing/Procedures: Your physician has recommended that you wear a 3 DAY ZIO-PATCH monitor. The Zio patch cardiac monitor continuously records heart rhythm data for up to 14 days, this is for patients being evaluated for multiple types heart rhythms. For the first 24 hours post application, please avoid getting the Zio monitor wet in the shower or by excessive sweating during exercise. After that, feel free to carry on with regular activities. Keep soaps and lotions away from the ZIO XT Patch.  This will be mailed to you, please expect 7-10 days to receive.  [employer] location - [place], [address].        Follow-Up: At [facility], you and your health needs are our priority.  As part of our continuing mission to provide you with exceptional heart care, we have created designated Provider Care Teams.  These Care Teams include your primary Cardiologist (physician) and Advanced Practice Providers (APPs -  Physician Assistants and Nurse Practitioners) who all work together to provide you with the care you need, when you need it.  We recommend signing up for the patient portal called "MyChart".  Sign up information is provided on this After Visit Summary.  MyChart is used to connect with patients for Virtual Visits (Telemedicine).  Patients are able to view lab/test results, encounter notes, upcoming appointments, etc.  Non-urgent messages can be sent to your provider as well.   To learn more about what you can do with MyChart, go to [email or web address].    Your next appointment:   6 week(s) follow up after 3 day  monitor  The format for your next appointment:   Virtual Visit   Provider:   Cherlynn Kaiser, MD   Other Instructions    ZIO XT- Long Term Monitor Instructions   Your  physician has requested you wear your ZIO patch monitor_____3__days.   This is a single patch monitor.  [employer] supplies one patch monitor per enrollment.  Additional stickers are not available.   Please do not apply patch if you will be having a Nuclear Stress Test, Echocardiogram, Cardiac CT, MRI, or Chest Xray during the time frame you would be wearing the monitor. The patch cannot be worn during these tests.  You cannot remove and re-apply the ZIO XT patch monitor.   Your ZIO patch monitor will be sent USPS Priority mail from [facility] directly to your home address. The monitor may also be mailed to a PO BOX if home delivery is not available.   It may take 3-5 days to receive your monitor after you have been enrolled.   Once you have received you monitor, please review enclosed instructions.  Your monitor has already been registered assigning a specific monitor serial # to you.   Applying the monitor   Shave hair from upper left chest.   Hold abrader disc by orange tab.  Rub abrader in 40 strokes over left upper chest as indicated in your monitor instructions.   Clean area with 4 enclosed alcohol pads .  Use all pads to assure are is cleaned thoroughly.  Let dry.   Apply patch as indicated in monitor instructions.  Patch will be place under collarbone on left side of chest with arrow pointing upward.   Rub patch adhesive wings for 2 minutes.Remove  white label marked "1".  Remove white label marked "2".  Rub patch adhesive wings for 2 additional minutes.   While looking in a mirror, press and release button in center of patch.  A small green light will flash 3-4 times .  This will be your only indicator the monitor has been turned on.     Do not shower for the first 24 hours.  You may shower after the first 24 hours.   Press button if you feel a symptom. You will hear a small click.  Record Date, Time and Symptom in the Patient Log Book.   When you are ready to remove patch,  follow instructions on last 2 pages of Patient Log Book.  Stick patch monitor onto last page of Patient Log Book.   Place Patient Log Book in [place] box.  Use locking tab on box and tape box closed securely.  The [employer] and [employer] has [employer] on it.  Please place in mailbox as soon as possible.  Your physician should have your test results approximately 7 days after the monitor has been mailed back to [facility].   Call [facility] at [phone] if you have questions regarding your ZIO XT patch monitor.  Call them immediately if you see an orange light blinking on your monitor.   If your monitor falls off in less than 4 days contact our Monitor department at [phone].  If your monitor becomes loose or falls off after 4 days call [employer] at [phone] for suggestions on securing your monitor.

## 2019-07-24 NOTE — Progress Notes (Signed)
Virtual Visit via Telephone Note   This visit type was conducted due to national recommendations for restrictions regarding the COVID-19 Pandemic (e.g. social distancing) in an effort to limit this patient's exposure and mitigate transmission in our community.  Due to his co-morbid illnesses, this patient is at least at moderate risk for complications without adequate follow up.  This format is felt to be most appropriate for this patient at this time.  The patient did not have access to video technology/had technical difficulties with video requiring transitioning to audio format only (telephone).  All issues noted in this document were discussed and addressed.  No physical exam could be performed with this format.  Please refer to the patient's chart for his  consent to telehealth for [facility].   The patient was identified using 2 identifiers.  Date:  07/24/2019   ID:  Rogelia Mire, DOB 1970/11/19, MRN [id number]  Patient Location: Home Provider Location: Office  PCP:  Lorrene Reid, PA-C  Cardiologist:  No primary care provider on file.  Electrophysiologist:  None   Evaluation Performed:  Follow-Up Visit  Chief Complaint:  F/u dizziness, palpitations, HTN  History of Present Illness:    Rogerio Boutelle is a 49 y.o. male with hyperlipidemia and no past cardiovascular history who presents for follow-up of episodic dizziness, palpitations, and hypertension.  Blood pressure readings apart once or twice a week are better on average than previous.  Averaging approximately 128/70 8 mmHg, previously in the 130s over 80s.  He is tolerating HCTZ 12.5 mg daily well without issue.  He notes that his 51 year old had a birthday party recently and while he was running up 8 stairs, at the top he could feel his heart pounding disproportionate to his workload.  This is a symptom he has described previously, with previous unexplained syncope, dizziness with standing, and prior description of  dizziness climbing stairs.  No improvement in symptoms after treatment with antihypertensive therapy.  He denies concomitant chest pain.  Denies significant shortness of breath.  We performed an exercise treadmill test which showed no ischemia and excellent exercise capacity, and echocardiogram showed no LVOT obstruction or cardiomyopathic process.  The patient does not have symptoms concerning for COVID-19 infection (fever, chills, cough, or new shortness of breath).    No past medical history on file. No past surgical history on file.   Current Meds  Medication Sig  . hydrochlorothiazide (MICROZIDE) 12.5 MG capsule Take 1 capsule (12.5 mg total) by mouth daily.  . rosuvastatin (CRESTOR) 5 MG tablet 0.5 tab q hs Mon, Wed, Fri nights only     Allergies:   Patient has no known allergies.   Social History   Tobacco Use  . Smoking status: Former Smoker    Years: 3.00    Types: Cigarettes  . Smokeless tobacco: Never Used  . Tobacco comment: quit 30 years   Vaping Use  . Vaping Use: Never used  Substance Use Topics  . Alcohol use: Yes    Alcohol/week: 6.0 standard drinks    Types: 6 Standard drinks or equivalent per week  . Drug use: Never     Family Hx: The patient's family history includes Hyperlipidemia in his father and mother; Stroke in his father.  ROS:   Please see the history of present illness.     All other systems reviewed and are negative.   Prior CV studies:   The following studies were reviewed today:    Labs/Other Tests and Data Reviewed:  EKG:  No ECG reviewed.  Recent Labs: 09/19/2018: BUN 17; Creatinine, Ser 1.18; Potassium 4.8; Sodium 143 06/27/2019: ALT 23   Recent Lipid Panel Lab Results  Component Value Date/Time   CHOL 226 (H) 06/27/2019 09:05 AM   TRIG 227 (H) 06/27/2019 09:05 AM   HDL 36 (L) 06/27/2019 09:05 AM   CHOLHDL 6.3 (H) 06/27/2019 09:05 AM   LDLCALC 148 (H) 06/27/2019 09:05 AM    Wt Readings from Last 3 Encounters:    07/24/19 210 lb (95.3 kg)  06/26/19 211 lb 6.4 oz (95.9 kg)  05/17/19 209 lb (94.8 kg)     Objective:    Vital Signs:  Ht 6' (1.829 m)   Wt 210 lb (95.3 kg)   BMI 28.48 kg/m    VITAL SIGNS:  reviewed GEN:  no acute distress RESPIRATORY:  normal respiratory effort, no increased work of breathing NEURO:  alert and oriented x 3, speech normal PSYCH:  normal affect   ASSESSMENT & PLAN:    1. Palpitations   2. Dizziness   3. Lightheadedness   4. Hyperlipidemia, unspecified hyperlipidemia type   5. Elevated BP without diagnosis of hypertension    Palpitations- still having bothersome dizziness and heart pounding with incline activity. He is concerned about this and we will perform a 3 day monitor and request that he provoke his symptoms to evaluate further, to exclude malignant arrhythmia.   HLD - continue crestor  Elevated BP - continue HCTZ, tolerating well. HCTZ 12.5 mg daily  COVID-19 Education: The signs and symptoms of COVID-19 were discussed with the patient and how to seek care for testing (follow up with PCP or arrange E-visit).  The importance of social distancing was discussed today.  Time:   Today, I have spent 16 minutes with the patient with telehealth technology discussing the above problems.     Medication Adjustments/Labs and Tests Ordered: Current medicines are reviewed at length with the patient today.  Concerns regarding medicines are outlined above.   Tests Ordered: Orders Placed This Encounter  Procedures  . LONG TERM MONITOR (3-14 DAYS)    Medication Changes: No orders of the defined types were placed in this encounter.   Follow Up: after monitor, virtual  Signed, Elouise Munroe, MD  07/24/2019 8:46 AM    [facility]

## 2019-07-24 NOTE — Telephone Encounter (Signed)
1st attempt ; no answer, left message

## 2019-07-24 NOTE — Telephone Encounter (Signed)
RN left message on patient's voicemail. Instruction were given  from today's virtual visit 07/24/19 .  AVS SUMMARY has been sent by mail . With instructions for zio monitor--   appointment schedule - virtual

## 2019-07-25 ENCOUNTER — Encounter: Payer: Self-pay | Admitting: *Deleted

## 2019-07-25 NOTE — Progress Notes (Signed)
Patient ID: Randy [facility], male   DOB: [birth year], 49 y.o.   MRN: [id number] Patient enrolled for [employer] to ship a 3 day ZIO XT long term holter monitor to her home.

## 2019-08-05 ENCOUNTER — Ambulatory Visit (INDEPENDENT_AMBULATORY_CARE_PROVIDER_SITE_OTHER): Payer: 59

## 2019-08-05 DIAGNOSIS — R002 Palpitations: Secondary | ICD-10-CM

## 2019-08-30 ENCOUNTER — Telehealth: Payer: 59 | Admitting: Internal Medicine

## 2019-09-03 ENCOUNTER — Encounter: Payer: Self-pay | Admitting: Internal Medicine

## 2019-09-03 ENCOUNTER — Ambulatory Visit: Payer: Self-pay

## 2019-09-03 ENCOUNTER — Encounter: Payer: Self-pay | Admitting: Family Medicine

## 2019-09-03 ENCOUNTER — Other Ambulatory Visit: Payer: Self-pay

## 2019-09-03 ENCOUNTER — Ambulatory Visit (INDEPENDENT_AMBULATORY_CARE_PROVIDER_SITE_OTHER): Payer: 59 | Admitting: Family Medicine

## 2019-09-03 DIAGNOSIS — M1612 Unilateral primary osteoarthritis, left hip: Secondary | ICD-10-CM | POA: Diagnosis not present

## 2019-09-03 NOTE — Progress Notes (Signed)
   Office Visit Note   Patient: Randy Bell           Date of Birth: [birth year]           MRN: [id number] Visit Date: 09/03/2019 Requested by: Lorrene Reid, PA-C [place],  [place] [address] PCP: Lorrene Reid, PA-C  Subjective: Chief Complaint  Patient presents with  . Left Hip - Pain    Requesting injection. The cortisone injection 03/05/19 lasted up until couple weeks ago.    HPI: He is here with recurrent left hip pain.  Injection in February with cortisone gave him relief until just recently.  He would like to try another one.              ROS:   All other systems were reviewed and are negative.  Objective: Vital Signs: There were no vitals taken for this visit.  Physical Exam:  General:  Alert and oriented, in no acute distress. Pulm:  Breathing unlabored. Psy:  Normal mood, congruent affect.  Left hip: Pain with passive internal rotation.  Still has pretty good range of motion.  Imaging: US Guided Needle Placement - No Linked Charges  Result Date: 09/03/2019 Ultrasound-guided left hip injection: After sterile prep with Betadine, injected 8 cc 1% lidocaine without epinephrine and 40 mg methylprednisolone using a 22-gauge spinal needle, passing the needle through the iliofemoral ligament into the femoral head/neck junction.  Injectate seen filling joint capsule.     Assessment & Plan: 1.  Chronic left hip pain with DJD -Steroid injection again today.  If he does not get long-term relief, then dextrose prolotherapy.     Procedures: No procedures performed  No notes on file     PMFS History: Patient Active Problem List   Diagnosis Date Noted  . Episodic lightheadedness 04/26/2019  . Elevated blood pressure reading without diagnosis of hypertension 10/05/2018  . Hyperlipidemia 10/05/2018  . Family history of stroke or transient ischemic attack in father 08/01/2018  . Family history of hyperlipidemia- Mom and Dad 08/01/2018  .  Bradycardia, unspecified 08/01/2018  . Fluttering sensation of heart- with rest and lasts 2-3 sec 08/01/2018  . 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 08/01/2018  . Primary osteoarthritis of left hip 05/16/2018   History reviewed. No pertinent past medical history.  Family History  Problem Relation Age of Onset  . Hyperlipidemia Mother   . Hyperlipidemia Father   . Stroke Father     History reviewed. No pertinent surgical history. Social History   Occupational History  . Not on file  Tobacco Use  . Smoking status: Former Smoker    Years: 3.00    Types: Cigarettes  . Smokeless tobacco: Never Used  . Tobacco comment: quit 30 years   Vaping Use  . Vaping Use: Never used  Substance and Sexual Activity  . Alcohol use: Yes    Alcohol/week: 6.0 standard drinks    Types: 6 Standard drinks or equivalent per week  . Drug use: Never  . Sexual activity: Not on file

## 2019-09-13 ENCOUNTER — Telehealth: Payer: 59 | Admitting: Internal Medicine

## 2019-09-28 ENCOUNTER — Telehealth: Payer: Self-pay | Admitting: Internal Medicine

## 2019-09-28 NOTE — Telephone Encounter (Signed)
Patient returning call for monitor results. 

## 2019-09-28 NOTE — Telephone Encounter (Signed)
Reviewed result w/ pt, who verbalized understanding.  Will forward note to RN to help arrange f/u w/ MD, his follow up was supposed to be on 8/19, but this was cancelled by our office, so pt needs an appt.  He is aware RN will follow up next week to get him rescheduled. He is agreeable to planl

## 2019-09-28 NOTE — Telephone Encounter (Signed)
Message sent to scheduling to arrange follow up appointment.

## 2019-10-17 ENCOUNTER — Encounter: Payer: Self-pay | Admitting: Internal Medicine

## 2019-10-17 ENCOUNTER — Telehealth (INDEPENDENT_AMBULATORY_CARE_PROVIDER_SITE_OTHER): Payer: 59 | Admitting: Internal Medicine

## 2019-10-17 VITALS — Ht 72.0 in | Wt 203.0 lb

## 2019-10-17 DIAGNOSIS — E785 Hyperlipidemia, unspecified: Secondary | ICD-10-CM

## 2019-10-17 DIAGNOSIS — R030 Elevated blood-pressure reading, without diagnosis of hypertension: Secondary | ICD-10-CM

## 2019-10-17 DIAGNOSIS — R002 Palpitations: Secondary | ICD-10-CM

## 2019-10-17 DIAGNOSIS — R42 Dizziness and giddiness: Secondary | ICD-10-CM | POA: Diagnosis not present

## 2019-10-17 NOTE — Patient Instructions (Addendum)
Medication Instructions:  Continue Current Medications *If you need a refill on your cardiac medications before your next appointment, please call your pharmacy*  Lab Work: None Ordered At This Time.  If you have labs (blood work) drawn today and your tests are completely normal, you will receive your results only by: Marland Kitchen MyChart Message (if you have MyChart) OR . A paper copy in the mail If you have any lab test that is abnormal or we need to change your treatment, we will call you to review the results.  Testing/Procedures: None Ordered At This Time.   Follow-Up: At [facility], you and your health needs are our priority.  As part of our continuing mission to provide you with exceptional heart care, we have created designated Provider Care Teams.  These Care Teams include your primary Cardiologist (physician) and Advanced Practice Providers (APPs -  Physician Assistants and Nurse Practitioners) who all work together to provide you with the care you need, when you need it.  We recommend signing up for the patient portal called "MyChart".  Sign up information is provided on this After Visit Summary.  MyChart is used to connect with patients for Virtual Visits (Telemedicine).  Patients are able to view lab/test results, encounter notes, upcoming appointments, etc.  Non-urgent messages can be sent to your provider as well.   To learn more about what you can do with MyChart, go to [email or web address].    Your next appointment:   AS NEEDED   The format for your next appointment:   In Person  Provider:   Cherlynn Kaiser, MD

## 2019-10-17 NOTE — Progress Notes (Signed)
Virtual Visit via Telephone Note   This visit type was conducted due to national recommendations for restrictions regarding the COVID-19 Pandemic (e.g. social distancing) in an effort to limit this patient's exposure and mitigate transmission in our community.  Due to his co-morbid illnesses, this patient is at least at moderate risk for complications without adequate follow up.  This format is felt to be most appropriate for this patient at this time.  The patient did not have access to video technology/had technical difficulties with video requiring transitioning to audio format only (telephone).  All issues noted in this document were discussed and addressed.  No physical exam could be performed with this format.  Please refer to the patient's chart for his  consent to telehealth for [facility].    Date:  10/17/2019   ID:  Randy Bell, DOB [birth year], MRN [id number] The patient was identified using 2 identifiers.  Patient Location: Home Provider Location: Home Office  PCP:  Lorrene Reid, PA-C  Cardiologist:  Elouise Munroe, MD  Electrophysiologist:  None   Evaluation Performed:  Follow-Up Visit  Chief Complaint: Follow-up dizziness, palpitations, hypertension  History of Present Illness:    Randy Bell is a 49 y.o. male with hyperlipidemia, elevated BP, and no past cardiovascular history.  Presents for follow-up.  He is doing better.  Episodes of dizziness have decreased.  Denies chest pain or shortness of breath.  No clear reason for improvement.  He cannot correlate improved symptoms with taking his HCTZ which he does most days.  Notes that when he takes HCTZ his BP is noticeably lower.  We discussed monitor results which showed an episode of dizziness correlating with sinus bradycardia.  Of note we did an ETT to evaluate for chronotropic incompetence, he was able to elevate his heart rate appropriately and had excellent exercise capacity.  No concerns for chronotropic  incompetence as a source of dizziness at this time.  The patient does not have symptoms concerning for COVID-19 infection (fever, chills, cough, or new shortness of breath).    No past medical history on file. No past surgical history on file.   No outpatient medications have been marked as taking for the 10/17/19 encounter (Telemedicine) with Elouise Munroe, MD.     Allergies:   Patient has no known allergies.   Social History   Tobacco Use  . Smoking status: Former Smoker    Years: 3.00    Types: Cigarettes  . Smokeless tobacco: Never Used  . Tobacco comment: quit 30 years   Vaping Use  . Vaping Use: Never used  Substance Use Topics  . Alcohol use: Yes    Alcohol/week: 6.0 standard drinks    Types: 6 Standard drinks or equivalent per week  . Drug use: Never     Family Hx: The patient's family history includes Hyperlipidemia in his father and mother; Stroke in his father.  ROS:   Please see the history of present illness.     All other systems reviewed and are negative.   Prior CV studies:   The following studies were reviewed today:  Monitor  Labs/Other Tests and Data Reviewed:    EKG:  No ECG reviewed.  Recent Labs: 06/27/2019: ALT 23   Recent Lipid Panel Lab Results  Component Value Date/Time   CHOL 226 (H) 06/27/2019 09:05 AM   TRIG 227 (H) 06/27/2019 09:05 AM   HDL 36 (L) 06/27/2019 09:05 AM   CHOLHDL 6.3 (H) 06/27/2019 09:05 AM   LDLCALC  148 (H) 06/27/2019 09:05 AM    Wt Readings from Last 3 Encounters:  10/17/19 203 lb (92.1 kg)  07/24/19 210 lb (95.3 kg)  06/26/19 211 lb 6.4 oz (95.9 kg)     Objective:    Vital Signs:  Ht 6' (1.829 m)   Wt 203 lb (92.1 kg)   BMI 27.53 kg/m    VITAL SIGNS:  reviewed GEN:  no acute distress RESPIRATORY:  normal respiratory effort, no increased work of breathing NEURO:  alert and oriented x 3, speech normal PSYCH:  normal affect   ASSESSMENT & PLAN:    Dizziness Palpitations -Improved,  continue to monitor.  If symptoms recur, would recommend follow-up.  Elevated BP without diagnosis of hypertension -Continue HCTZ 12.5 mg daily.  Hyperlipidemia, unspecified hyperlipidemia type -Continue Crestor 5 mg daily.  Labs due tomorrow with his PCP, I would be happy to review these when available.  COVID-19 Education: The signs and symptoms of COVID-19 were discussed with the patient and how to seek care for testing (follow up with PCP or arrange E-visit).  The importance of social distancing was discussed today.  Time:   Today, I have spent 8 minutes with the patient with telehealth technology discussing the above problems.     Medication Adjustments/Labs and Tests Ordered: Current medicines are reviewed at length with the patient today.  Concerns regarding medicines are outlined above.   Patient Instructions  Medication Instructions:  Continue Current Medications *If you need a refill on your cardiac medications before your next appointment, please call your pharmacy*  Lab Work: None Ordered At This Time.  If you have labs (blood work) drawn today and your tests are completely normal, you will receive your results only by: Marland Kitchen MyChart Message (if you have MyChart) OR . A paper copy in the mail If you have any lab test that is abnormal or we need to change your treatment, we will call you to review the results.  Testing/Procedures: None Ordered At This Time.   Follow-Up: At [facility], you and your health needs are our priority.  As part of our continuing mission to provide you with exceptional heart care, we have created designated Provider Care Teams.  These Care Teams include your primary Cardiologist (physician) and Advanced Practice Providers (APPs -  Physician Assistants and Nurse Practitioners) who all work together to provide you with the care you need, when you need it.  We recommend signing up for the patient portal called "MyChart".  Sign up information is  provided on this After Visit Summary.  MyChart is used to connect with patients for Virtual Visits (Telemedicine).  Patients are able to view lab/test results, encounter notes, upcoming appointments, etc.  Non-urgent messages can be sent to your provider as well.   To learn more about what you can do with MyChart, go to [email or web address].    Your next appointment:   AS NEEDED   The format for your next appointment:   In Person  Provider:   Cherlynn Kaiser, MD    Signed, Elouise Munroe, MD  10/17/2019 8:05 AM    [place]

## 2019-10-18 ENCOUNTER — Other Ambulatory Visit: Payer: Self-pay

## 2019-10-18 ENCOUNTER — Other Ambulatory Visit: Payer: 59

## 2019-10-18 DIAGNOSIS — E785 Hyperlipidemia, unspecified: Secondary | ICD-10-CM

## 2019-10-19 LAB — LIPID PANEL
Chol/HDL Ratio: 5.6 ratio — ABNORMAL HIGH (ref 0.0–5.0)
Cholesterol, Total: 220 mg/dL — ABNORMAL HIGH (ref 100–199)
HDL: 39 mg/dL — ABNORMAL LOW (ref >39)
LDL Chol Calc (NIH): 158 mg/dL — ABNORMAL HIGH (ref 0–99)
Triglycerides: 128 mg/dL (ref 0–149)
VLDL Cholesterol Cal: 23 mg/dL (ref 5–40)

## 2019-10-23 ENCOUNTER — Other Ambulatory Visit: Payer: Self-pay

## 2019-10-23 ENCOUNTER — Ambulatory Visit (INDEPENDENT_AMBULATORY_CARE_PROVIDER_SITE_OTHER): Payer: 59 | Admitting: Physician Assistant

## 2019-10-23 ENCOUNTER — Encounter: Payer: Self-pay | Admitting: Physician Assistant

## 2019-10-23 VITALS — Ht 72.0 in | Wt 205.0 lb

## 2019-10-23 DIAGNOSIS — R030 Elevated blood-pressure reading, without diagnosis of hypertension: Secondary | ICD-10-CM

## 2019-10-23 DIAGNOSIS — E785 Hyperlipidemia, unspecified: Secondary | ICD-10-CM

## 2019-10-23 NOTE — Progress Notes (Signed)
Telehealth office visit note for Lorrene Reid, PA-C- at Primary Care at [facility]   I connected with current patient today by telephone and verified that I am speaking with the correct person   . Location of the patient: Home . Location of the provider: Office - This visit type was conducted due to national recommendations for restrictions regarding the COVID-19 Pandemic (e.g. social distancing) in an effort to limit this patient's exposure and mitigate transmission in our community.    - No physical exam could be performed with this format, beyond that communicated to [place] by the patient/ family members as noted.   - Additionally my office staff/ schedulers were to discuss with the patient that there may be a monetary charge related to this service, depending on their medical insurance.  My understanding is that patient understood and consented to proceed.     _________________________________________________________________________________   History of Present Illness: Pt calls in to follow up on hyperlipidemia and discuss most recent labs. States he is upset his lipid panel remains elevated despite starting statin medication. He stays very active and his diet consists of home cooked meals, chicken, fish, red meat, pork, low fat milk, whole grains, and vegetables. He does not eat out. Reports he had a complete cardiology work-up for his dizziness and no clear cause has been found for his symptoms. He does check his BP at home and readings usually range in 128/70.     No flowsheet data found.  Depression screen [facility] 2/9 10/23/2019 04/26/2019 10/05/2018 09/19/2018 08/01/2018  Decreased Interest 0 0 0 0 0  Down, Depressed, Hopeless 0 0 0 0 0  PHQ - 2 Score 0 0 0 0 0  Altered sleeping 0 0 0 0 0  Tired, decreased energy 0 0 0 0 0  Change in appetite 0 0 0 0 0  Feeling bad or failure about yourself  0 0 0 0 0  Trouble concentrating 0 0 0 0 0  Moving slowly or fidgety/restless 0 0 0 0 0   Suicidal thoughts 0 0 0 0 0  PHQ-9 Score 0 0 0 0 0  Difficult doing work/chores - - Not difficult at all Not difficult at all -      Impression and Recommendations:     1. Hyperlipidemia, unspecified hyperlipidemia type   2. Elevated blood pressure reading without diagnosis of hypertension     Hyperlipidemia:  -Most recent panel: total cholesterol 220, triglycerides 128, HDL 39, LDL 158 -Discussed with patient medication adjustments for Crestor and states he prefers to increase to full tablet of 5 mg of Crestor three times weekly. Advised I will forward labs to cardiologist and await their recommendation and will let him know. Pt verbalized understanding. -Recommend to continue with physical activity level and follow a heart healthy diet low in saturated and trans fats.  -Recommend to follow up in 3-4 months and patient prefers to schedule follow up appointment in 6 months.  Elevated blood pressure reading without diagnosis of hypertension: -Followed by Cardiology. -BP stable -Continue current medication regimen. -Continue ambulatory BP monitoring. -Follow a low sodium diet. -Will continue to monitor alongside cardiology.   - As part of my medical decision making, I reviewed the following data within the [facility] History obtained from pt /family, CMA notes reviewed and incorporated if applicable, Labs reviewed, Radiograph/ tests reviewed if applicable and OV notes from prior OV's with me, as well as any other specialists she/he  has seen since seeing me last, were all reviewed and used in my medical decision making process today.    - Additionally, when appropriate, discussion had with patient regarding our treatment plan, and their biases/concerns about that plan were used in my medical decision making today.    - The patient agreed with the plan and demonstrated an understanding of the instructions.   No barriers to understanding were identified.     - The  patient was advised to call back or seek an in-person evaluation if the symptoms worsen or if the condition fails to improve as anticipated.   Return for HLD, elevated BP in 4-6 months.    No orders of the defined types were placed in this encounter.   No orders of the defined types were placed in this encounter.   There are no discontinued medications.     Time spent on visit including pre-visit chart review and post-visit care was 8 minutes.      The [place] was signed into law in 2016 which includes the topic of electronic health records.  This provides immediate access to information in MyChart.  This includes consultation notes, operative notes, office notes, lab results and pathology reports.  If you have any questions about what you read please let us know at your next visit or call us at the office.  We are right here with you.  Note:  This note was prepared with assistance of Dragon voice recognition software. Occasional wrong-word or sound-a-like substitutions may have occurred due to the inherent limitations of voice recognition software.  __________________________________________________________________________________     Patient Care Team    Relationship Specialty Notifications Start End  Lorrene Reid, [place] PCP - General   05/27/19   Elouise Munroe, MD PCP - Cardiology Cardiology  10/17/19   Eunice Blase, MD  Family Medicine  08/01/18   Mcarthur Rossetti, MD Consulting Physician Orthopedic Surgery  08/01/18    Comment: L hip  Dasher, Rayvon Char, MD  Dermatology  09/19/18      -Vitals obtained; medications/ allergies reconciled;  personal medical, social, Sx etc.histories were updated by CMA, reviewed by me and are reflected in chart   Patient Active Problem List   Diagnosis Date Noted  . Episodic lightheadedness 04/26/2019  . Elevated blood pressure reading without diagnosis of hypertension 10/05/2018  . Hyperlipidemia 10/05/2018  .  Family history of stroke or transient ischemic attack in father 08/01/2018  . Family history of hyperlipidemia- Mom and Dad 08/01/2018  . Bradycardia, unspecified 08/01/2018  . Fluttering sensation of heart- with rest and lasts 2-3 sec 08/01/2018  . 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 08/01/2018  . Primary osteoarthritis of left hip 05/16/2018     Current Meds  Medication Sig  . hydrochlorothiazide (MICROZIDE) 12.5 MG capsule Take 1 capsule (12.5 mg total) by mouth daily.  . rosuvastatin (CRESTOR) 5 MG tablet 0.5 tab q hs Mon, Wed, Fri nights only     Allergies:  No Known Allergies   ROS:  See above HPI for pertinent positives and negatives   Objective:   Height 6' (1.829 m), weight 205 lb (93 kg).  (if some vitals are omitted, this means that patient was UNABLE to obtain them even though they were asked to get them prior to OV today.  They were asked to call us at their earliest convenience with these once obtained. ) General: A & O * 3; sounds in no acute distress Respiratory: speaking in full  sentences, no conversational dyspnea Psych: insight appears good, mood- appears full

## 2019-10-30 ENCOUNTER — Telehealth: Payer: Self-pay | Admitting: Physician Assistant

## 2019-10-30 NOTE — Telephone Encounter (Signed)
-----   Message from Elouise Munroe, MD sent at 10/26/2019  5:12 PM EDT ----- Regarding: RE: Hyperlipidemia Agree with increasing to Crestor 5 mg three times weekly. He may need additional therapy - let me know if he wants to follow up with the pharmacist in my office for discussion of additional therapies.  Thanks, Nadean Corwin ----- Message ----- From: Lorrene Reid, PA-C Sent: 10/23/2019   4:29 PM EDT To: Elouise Munroe, MD Subject: Hyperlipidemia                                 Hi Dr. Margaretann Loveless,  Please review recent labs for Rogelia Mire. Some improvement but LDL increased from prior and patient is currently taking 0.5 tablet of Crestor 5 mg three times weekly. He declines increasing frequency and is agreeable to taking full tablet three times weekly. I advised him I would get your recommendation and let him know.  Thank you, Herb Grays

## 2019-11-04 ENCOUNTER — Other Ambulatory Visit: Payer: Self-pay | Admitting: Internal Medicine

## 2019-11-07 ENCOUNTER — Telehealth: Payer: Self-pay | Admitting: Physician Assistant

## 2019-11-07 DIAGNOSIS — E785 Hyperlipidemia, unspecified: Secondary | ICD-10-CM

## 2019-11-07 DIAGNOSIS — R030 Elevated blood-pressure reading, without diagnosis of hypertension: Secondary | ICD-10-CM

## 2019-11-07 MED ORDER — HYDROCHLOROTHIAZIDE 12.5 MG PO CAPS: 12.5000 mg | ORAL_CAPSULE | Freq: Every day | ORAL | 0 refills | Status: DC

## 2019-11-07 MED ORDER — ROSUVASTATIN CALCIUM 5 MG PO TABS: ORAL_TABLET | ORAL | 0 refills | Status: DC

## 2019-11-07 NOTE — Addendum Note (Signed)
Addended by: Mickel Crow on: 11/07/2019 11:29 AM   Modules accepted: Orders

## 2019-11-07 NOTE — Telephone Encounter (Signed)
Patient was advised to increase his Crestor by PCP and now has ran out sooner than expected based on last order placed to pharm, so he is need of a refill of this med.  Also, he says he is about out of his BP med too, so if these items are approved please send to [facility] in [place] on [address].

## 2019-11-07 NOTE — Telephone Encounter (Signed)
Refills have been sent to pharmacy per [facility]. AS, CMA

## 2020-01-11 ENCOUNTER — Other Ambulatory Visit: Payer: Self-pay | Admitting: Physician Assistant

## 2020-01-11 DIAGNOSIS — E785 Hyperlipidemia, unspecified: Secondary | ICD-10-CM

## 2020-02-04 ENCOUNTER — Other Ambulatory Visit: Payer: Self-pay | Admitting: Physician Assistant

## 2020-02-04 DIAGNOSIS — R030 Elevated blood-pressure reading, without diagnosis of hypertension: Secondary | ICD-10-CM

## 2020-02-13 ENCOUNTER — Ambulatory Visit (INDEPENDENT_AMBULATORY_CARE_PROVIDER_SITE_OTHER): Payer: 59

## 2020-02-13 ENCOUNTER — Ambulatory Visit (INDEPENDENT_AMBULATORY_CARE_PROVIDER_SITE_OTHER): Payer: 59 | Admitting: Orthopaedic Surgery

## 2020-02-13 VITALS — Ht 72.0 in | Wt 205.0 lb

## 2020-02-13 DIAGNOSIS — M1612 Unilateral primary osteoarthritis, left hip: Secondary | ICD-10-CM | POA: Diagnosis not present

## 2020-02-13 NOTE — Progress Notes (Signed)
Office Visit Note   Patient: Randy Bell           Date of Birth: [birth year]           MRN: [id number] Visit Date: 02/13/2020              Requested by: Lorrene Reid, PA-C [address] [place],  [place] [address] PCP: Lorrene Reid, PA-C   Assessment & Plan: Visit Diagnoses:  1. Primary osteoarthritis of left hip     Plan: Based on his radiographic findings and clinical exam findings, I agree with the recommendation for a left total hip arthroplasty.  He has tried and failed all forms of conservative treatment for over 12 months now including multiple steroid injections.  I did show him a hip model and went over his x-rays in detail.  I talked about hip replacement surgery.  I described the risk and benefits of the surgery and what to expect from an intraoperative and postoperative course.  All questions and concerns were answered and addressed.  He is interested in having this scheduled hopefully in the near future.  Follow-Up Instructions: Return for 2 weeks post-op.   Orders:  Orders Placed This Encounter  Procedures  . XR HIP UNILAT W OR W/O PELVIS 1V LEFT   No orders of the defined types were placed in this encounter.     Procedures: No procedures performed   Clinical Data: No additional findings.   Subjective: Chief Complaint  Patient presents with  . Left Hip - Pain  The patient is someone I am seeing for the first time.  He is referred by Dr. Junius Roads for evaluation treatment of severe osteoarthritis of the left hip this started out as femoral acetabular impingement.  He has had at least 2 intra-articular steroid injections in that left hip joint under ultrasound with the last being 5 months ago.  He says the first injections helped great but the last injections have not lasted him as long.  He has severe pain with hip abduction.  It is pain in the groin on the left side.  At this point his left hip pain is definitely affecting his actives of daily  living, his quality of life, and his mobility.  He is a very athletic individual and is worked on hip strengthening exercises and activity modification.  He takes anti-inflammatories when he can and again has had multiple steroid injections in that left hip.  His x-rays also confirm severe femoral acetabular impingement and severe osteoarthritis of the left hip.  At this point he wishes to consider hip replacement surgery given the failed conservative treatment for well over a year. HPI  Review of Systems He currently denies any headache, chest pain, shortness of breath, fever, chills, nausea, vomiting  Objective: Vital Signs: Ht 6' (1.829 m)   Wt 205 lb (93 kg)   BMI 27.80 kg/m   Physical Exam He is alert and oriented x3 and in no acute distress Ortho Exam Examination of his left hip shows that he has good range of motion of the hip but pain on extremes of rotation and severe pain with hip abduction on the left side.  His right hip exam is normal. Specialty Comments:  No specialty comments available.  Imaging: XR HIP UNILAT W OR W/O PELVIS 1V LEFT  Result Date: 02/13/2020 An AP pelvis and lateral left hip shows significant arthritis of the left hip.  There is evidence of previous femoral acetabular impingement.  There  is flattening of the lateral femoral head and particular osteophytes at the acetabulum and the femoral head and neck.  There is significant superior lateral joint space narrowing.    PMFS History: Patient Active Problem List   Diagnosis Date Noted  . Episodic lightheadedness 04/26/2019  . Elevated blood pressure reading without diagnosis of hypertension 10/05/2018  . Hyperlipidemia 10/05/2018  . Family history of stroke or transient ischemic attack in father 08/01/2018  . Family history of hyperlipidemia- Mom and Dad 08/01/2018  . Bradycardia, unspecified 08/01/2018  . Fluttering sensation of heart- with rest and lasts 2-3 sec 08/01/2018  . Orthostatic dizziness-  has bradycardia 08/01/2018  . Primary osteoarthritis of left hip 05/16/2018   No past medical history on file.  Family History  Problem Relation Age of Onset  . Hyperlipidemia Mother   . Hyperlipidemia Father   . Stroke Father     No past surgical history on file. Social History   Occupational History  . Not on file  Tobacco Use  . Smoking status: Former Smoker    Years: 3.00    Types: Cigarettes  . Smokeless tobacco: Never Used  . Tobacco comment: quit 30 years   Vaping Use  . Vaping Use: Never used  Substance and Sexual Activity  . Alcohol use: Yes    Alcohol/week: 6.0 standard drinks    Types: 6 Standard drinks or equivalent per week  . Drug use: Never  . Sexual activity: Not on file

## 2020-03-03 ENCOUNTER — Telehealth: Payer: Self-pay | Admitting: Physician Assistant

## 2020-03-03 DIAGNOSIS — E785 Hyperlipidemia, unspecified: Secondary | ICD-10-CM

## 2020-03-03 MED ORDER — ROSUVASTATIN CALCIUM 5 MG PO TABS: ORAL_TABLET | ORAL | 0 refills | Status: DC

## 2020-03-03 NOTE — Telephone Encounter (Signed)
Refill sent to requested pharmacy. Please contact pt to schedule OV per last AVS for further med refills. AS, CMA

## 2020-03-04 ENCOUNTER — Telehealth: Payer: Self-pay | Admitting: Physician Assistant

## 2020-03-04 NOTE — Telephone Encounter (Signed)
Please schedule lab apt for Lipid panel and CMP only 3 days before OV with Maritza or Heather. Patient has to schedule apt for follow up with provider in order to have labs done.

## 2020-03-19 ENCOUNTER — Other Ambulatory Visit: Payer: Self-pay | Admitting: Physician Assistant

## 2020-03-19 ENCOUNTER — Other Ambulatory Visit: Payer: Self-pay

## 2020-03-25 ENCOUNTER — Telehealth: Payer: Self-pay | Admitting: Physician Assistant

## 2020-03-25 ENCOUNTER — Other Ambulatory Visit: Payer: Self-pay | Admitting: Physician Assistant

## 2020-03-25 DIAGNOSIS — E785 Hyperlipidemia, unspecified: Secondary | ICD-10-CM

## 2020-03-25 MED ORDER — ROSUVASTATIN CALCIUM 5 MG PO TABS: 5.0000 mg | ORAL_TABLET | ORAL | 0 refills | Status: DC

## 2020-03-25 NOTE — Patient Instructions (Addendum)
DUE TO COVID-19 ONLY ONE VISITOR IS ALLOWED TO COME WITH YOU AND STAY IN THE WAITING ROOM ONLY DURING PRE OP AND PROCEDURE.   IF YOU WILL BE ADMITTED INTO THE HOSPITAL YOU ARE ALLOWED ONLY TWO SUPPORT PEOPLE DURING VISITATION HOURS ONLY (10AM -8PM)   . The support person(s) may change daily. . The support person(s) must pass our screening, gel in and out, and wear a mask at all times, including in the patient's room. . Patients must also wear a mask when staff or their support person are in the room.  No visitors under the age of 65. Any visitor under the age of 36 must be accompanied by an adult.         Your procedure is scheduled on:  Thursday, 04-03-20   Report to [facility] Main  Entrance    Report to admitting at 7:30 AM   Call this number if you have problems the morning of surgery [phone]   Do not eat food :After Midnight.   May have liquids until 7:00 AM day of surgery  CLEAR LIQUID DIET  Foods Allowed                                                                     Foods Excluded  Water, Black Coffee and tea, regular and decaf              liquids that you cannot  Plain Jell-O in any flavor  (No red)                                     see through such as: Fruit ices (not with fruit pulp)                                      milk, soups, orange juice              Iced Popsicles (No red)                                      All solid food                                   Apple juices Sports drinks like Gatorade (No red) Lightly seasoned clear broth or consume(fat free) Sugar, honey syrup     Complete one Ensure drink the morning of surgery at 7:00 AM the day of surgery.      1. The day of surgery:  ? Drink ONE (1) Pre-Surgery Clear Ensure or G2 by am the morning of surgery. Drink in one sitting. Do not sip.  ? This drink was given to you during your hospital  pre-op appointment visit. ? Nothing else to drink after completing the  Pre-Surgery  Clear Ensure or G2.          If you have questions, please contact your surgeon's office.     Oral Hygiene  is also important to reduce your risk of infection.                                    Remember - BRUSH YOUR TEETH THE MORNING OF SURGERY WITH YOUR REGULAR TOOTHPASTE   Do NOT smoke after Midnight   Take these medicines the morning of surgery with A SIP OF WATER:   Rosuvastatin                        You may not have any metal on your body including  jewelry, and body piercings             Do not wear  lotions, powders, perfumes/cologne, or deodorant             Men may shave face and neck.   Do not bring valuables to the hospital. [place].   Contacts, dentures or bridgework may not be worn into surgery.   Bring small overnight bag day of surgery.                 Please read over the following fact sheets you were given: IF YOU HAVE QUESTIONS ABOUT YOUR PRE OP INSTRUCTIONS PLEASE CALL  [place] - Preparing for Surgery Before surgery, you can play an important role.  Because skin is not sterile, your skin needs to be as free of germs as possible.  You can reduce the number of germs on your skin by washing with CHG (chlorahexidine gluconate) soap before surgery.  CHG is an antiseptic cleaner which kills germs and bonds with the skin to continue killing germs even after washing. Please DO NOT use if you have an allergy to CHG or antibacterial soaps.  If your skin becomes reddened/irritated stop using the CHG and inform your nurse when you arrive at Short Stay. Do not shave (including legs and underarms) for at least 48 hours prior to the first CHG shower.  You may shave your face/neck.  Please follow these instructions carefully:  1.  Shower with CHG Soap the night before surgery and the  morning of surgery.  2.  If you choose to wash your hair, wash your hair first as usual with your normal  shampoo.  3.   After you shampoo, rinse your hair and body thoroughly to remove the shampoo.                             4.  Use CHG as you would any other liquid soap.  You can apply chg directly to the skin and wash.  Gently with a scrungie or clean washcloth.  5.  Apply the CHG Soap to your body ONLY FROM THE NECK DOWN.   Do   not use on face/ open                           Wound or open sores. Avoid contact with eyes, ears mouth and   genitals (private parts).                       Wash face,  Genitals (private parts) with your normal soap.  6.  Wash thoroughly, paying special attention to the area where your    surgery  will be performed.  7.  Thoroughly rinse your body with warm water from the neck down.  8.  DO NOT shower/wash with your normal soap after using and rinsing off the CHG Soap.                9.  Pat yourself dry with a clean towel.            10.  Wear clean pajamas.            11.  Place clean sheets on your bed the night of your first shower and do not  sleep with pets. Day of Surgery : Do not apply any lotions/deodorants the morning of surgery.  Please wear clean clothes to the hospital/surgery center.  FAILURE TO FOLLOW THESE INSTRUCTIONS MAY RESULT IN THE CANCELLATION OF YOUR SURGERY  PATIENT SIGNATURE_________________________________  NURSE SIGNATURE__________________________________  ________________________________________________________________________   Randy Bell  An incentive spirometer is a tool that can help keep your lungs clear and active. This tool measures how well you are filling your lungs with each breath. Taking long deep breaths may help reverse or decrease the chance of developing breathing (pulmonary) problems (especially infection) following:  A long period of time when you are unable to move or be active. BEFORE THE PROCEDURE   If the spirometer includes an indicator to show your best effort, your nurse or respiratory therapist will  set it to a desired goal.  If possible, sit up straight or lean slightly forward. Try not to slouch.  Hold the incentive spirometer in an upright position. INSTRUCTIONS FOR USE  1. Sit on the edge of your bed if possible, or sit up as far as you can in bed or on a chair. 2. Hold the incentive spirometer in an upright position. 3. Breathe out normally. 4. Place the mouthpiece in your mouth and seal your lips tightly around it. 5. Breathe in slowly and as deeply as possible, raising the piston or the ball toward the top of the column. 6. Hold your breath for 3-5 seconds or for as long as possible. Allow the piston or ball to fall to the bottom of the column. 7. Remove the mouthpiece from your mouth and breathe out normally. 8. Rest for a few seconds and repeat Steps 1 through 7 at least 10 times every 1-2 hours when you are awake. Take your time and take a few normal breaths between deep breaths. 9. The spirometer may include an indicator to show your best effort. Use the indicator as a goal to work toward during each repetition. 10. After each set of 10 deep breaths, practice coughing to be sure your lungs are clear. If you have an incision (the cut made at the time of surgery), support your incision when coughing by placing a pillow or rolled up towels firmly against it. Once you are able to get out of bed, walk around indoors and cough well. You may stop using the incentive spirometer when instructed by your caregiver.  RISKS AND COMPLICATIONS  Take your time so you do not get dizzy or light-headed.  If you are in pain, you may need to take or ask for pain medication before doing incentive spirometry. It is harder to take a deep breath if you are having pain. AFTER USE  Rest and breathe slowly and easily.  It can be helpful to keep track of a  log of your progress. Your caregiver can provide you with a simple table to help with this. If you are using the spirometer at home, follow these  instructions: [place] IF:   You are having difficultly using the spirometer.  You have trouble using the spirometer as often as instructed.  Your pain medication is not giving enough relief while using the spirometer.  You develop fever of 100.5 F (38.1 C) or higher. SEEK IMMEDIATE MEDICAL CARE IF:   You cough up bloody sputum that had not been present before.  You develop fever of 102 F (38.9 C) or greater.  You develop worsening pain at or near the incision site. MAKE SURE YOU:   Understand these instructions.  Will watch your condition.  Will get help right away if you are not doing well or get worse. Document Released: 05/24/2006 Document Revised: 04/05/2011 Document Reviewed: 07/25/2006 Exit[employer] Patient Information 2014 Exit[employer], [place].   ________________________________________________________________________  WHAT IS A BLOOD TRANSFUSION? Blood Transfusion Information  A transfusion is the replacement of blood or some of its parts. Blood is made up of multiple cells which provide different functions.  Red blood cells carry oxygen and are used for blood loss replacement.  White blood cells fight against infection.  Platelets control bleeding.  Plasma helps clot blood.  Other blood products are available for specialized needs, such as hemophilia or other clotting disorders. BEFORE THE TRANSFUSION  Who gives blood for transfusions?   Healthy volunteers who are fully evaluated to make sure their blood is safe. This is blood bank blood. Transfusion therapy is the safest it has ever been in the practice of medicine. Before blood is taken from a donor, a complete history is taken to make sure that person has no history of diseases nor engages in risky social behavior (examples are intravenous drug use or sexual activity with multiple partners). The donor's travel history is screened to minimize risk of transmitting infections, such as malaria. The donated  blood is tested for signs of infectious diseases, such as HIV and hepatitis. The blood is then tested to be sure it is compatible with you in order to minimize the chance of a transfusion reaction. If you or a relative donates blood, this is often done in anticipation of surgery and is not appropriate for emergency situations. It takes many days to process the donated blood. RISKS AND COMPLICATIONS Although transfusion therapy is very safe and saves many lives, the main dangers of transfusion include:   Getting an infectious disease.  Developing a transfusion reaction. This is an allergic reaction to something in the blood you were given. Every precaution is taken to prevent this. The decision to have a blood transfusion has been considered carefully by your caregiver before blood is given. Blood is not given unless the benefits outweigh the risks. AFTER THE TRANSFUSION  Right after receiving a blood transfusion, you will usually feel much better and more energetic. This is especially true if your red blood cells have gotten low (anemic). The transfusion raises the level of the red blood cells which carry oxygen, and this usually causes an energy increase.  The nurse administering the transfusion will monitor you carefully for complications. HOME CARE INSTRUCTIONS  No special instructions are needed after a transfusion. You may find your energy is better. Speak with your caregiver about any limitations on activity for underlying diseases you may have. SEEK MEDICAL CARE IF:   Your condition is not improving after your transfusion.  You  develop redness or irritation at the intravenous (IV) site. SEEK IMMEDIATE MEDICAL CARE IF:  Any of the following symptoms occur over the next 12 hours:  Shaking chills.  You have a temperature by mouth above 102 F (38.9 C), not controlled by medicine.  Chest, back, or muscle pain.  People around you feel you are not acting correctly or are  confused.  Shortness of breath or difficulty breathing.  Dizziness and fainting.  You get a rash or develop hives.  You have a decrease in urine output.  Your urine turns a dark color or changes to pink, red, or brown. Any of the following symptoms occur over the next 10 days:  You have a temperature by mouth above 102 F (38.9 C), not controlled by medicine.  Shortness of breath.  Weakness after normal activity.  The white part of the eye turns yellow (jaundice).  You have a decrease in the amount of urine or are urinating less often.  Your urine turns a dark color or changes to pink, red, or brown. Document Released: 01/09/2000 Document Revised: 04/05/2011 Document Reviewed: 08/28/2007 [facility] Patient Information 2014 [place], [place].  _______________________________________________________________________

## 2020-03-25 NOTE — Telephone Encounter (Signed)
Note ----- Message from Elouise Munroe, MD sent at 10/26/2019  5:12 PM EDT ----- Regarding: RE: Hyperlipidemia Agree with increasing to Crestor 5 mg three times weekly. He may need additional therapy - let me know if he wants to follow up with the pharmacist in my office for discussion of additional therapies.  Thanks, Nadean Corwin ----- Message ----- From: Lorrene Reid, PA-C Sent: 10/23/2019   4:29 PM EDT To: Elouise Munroe, MD Subject: Hyperlipidemia                                 Hi Dr. Margaretann Loveless,  Please review recent labs for Rogelia Mire. Some improvement but LDL increased from prior and patient is currently taking 0.5 tablet of Crestor 5 mg three times weekly. He declines increasing frequency and is agreeable to taking full tablet three times weekly. I advised him I would get your recommendation and let him know.  Thank you, Herb Grays

## 2020-03-25 NOTE — Progress Notes (Addendum)
COVID Vaccine Completed:  x2 Date COVID Vaccine completed:  04-10-19 & 05-01-19 received booster: COVID vaccine manufacturer: Pfizer       Date of COVID positive in last 90 days: 01-23-20 Results on chart  PCP - Lorrene Reid, PA-C Cardiologist - Cherlynn Kaiser, MD  Chest x-ray - N/A EKG - 05-17-19 Epic Stress Test - 06-14-19 Epic ECHO - 06-01-19 Epic Cardiac Cath -  Pacemaker/ICD device last checked: Long Term Monitor - 08-05-19 Epic  Sleep Study - N/A CPAP -   Fasting Blood Sugar - N/A Checks Blood Sugar _____ times a day  Blood Thinner Instructions:  N/A Aspirin Instructions: Last Dose:  Activity level:  Can go up a flight of stairs and perform activities of daily living without stopping and without symptoms.  Able to exercise without symptoms     Anesthesia review:  Cardiac evaluation for bradycardia and dizziness.  Patient states that he continues to have periodic dizziness but not as severe as when he was evaluated.  Dizziness started at age 50  Elevated BUN and creatinine on PAT labs.  Patient denies shortness of breath, fever, cough and chest pain at PAT appointment   Patient verbalized understanding of instructions that were given to them at the PAT appointment. Patient was also instructed that they will need to review over the PAT instructions again at home before surgery.

## 2020-03-27 ENCOUNTER — Encounter (HOSPITAL_COMMUNITY): Payer: Self-pay

## 2020-03-27 ENCOUNTER — Other Ambulatory Visit: Payer: Self-pay

## 2020-03-27 ENCOUNTER — Encounter (HOSPITAL_COMMUNITY)
Admission: RE | Admit: 2020-03-27 | Discharge: 2020-03-27 | Disposition: A | Discharge status: Home / Self Care | Payer: 59 | Source: Ambulatory Visit | Attending: Orthopaedic Surgery | Admitting: Orthopaedic Surgery

## 2020-03-27 DIAGNOSIS — Z01812 Encounter for preprocedural laboratory examination: Secondary | ICD-10-CM | POA: Diagnosis not present

## 2020-03-27 HISTORY — DX: Dizziness and giddiness: R42

## 2020-03-27 HISTORY — DX: Essential (primary) hypertension: I10

## 2020-03-27 HISTORY — DX: Basal cell carcinoma of skin, unspecified: C44.91

## 2020-03-27 HISTORY — DX: Unspecified osteoarthritis, unspecified site: M19.90

## 2020-03-27 LAB — CBC
HCT: 45 % (ref 39.0–52.0)
Hemoglobin: 15 g/dL (ref 13.0–17.0)
MCH: 30.3 pg (ref 26.0–34.0)
MCHC: 33.3 g/dL (ref 30.0–36.0)
MCV: 90.9 fL (ref 80.0–100.0)
Platelets: 273 10*3/uL (ref 150–400)
RBC: 4.95 MIL/uL (ref 4.22–5.81)
RDW: 12.7 % (ref 11.5–15.5)
WBC: 7.6 10*3/uL (ref 4.0–10.5)
nRBC: 0 % (ref 0.0–0.2)

## 2020-03-27 LAB — BASIC METABOLIC PANEL
Anion gap: 9 (ref 5–15)
BUN: 22 mg/dL — ABNORMAL HIGH (ref 6–20)
CO2: 27 mmol/L (ref 22–32)
Calcium: 10.3 mg/dL (ref 8.9–10.3)
Chloride: 103 mmol/L (ref 98–111)
Creatinine, Ser: 1.52 mg/dL — ABNORMAL HIGH (ref 0.61–1.24)
GFR, Estimated: 55 mL/min — ABNORMAL LOW (ref >60)
Glucose, Bld: 94 mg/dL (ref 70–99)
Potassium: 4.8 mmol/L (ref 3.5–5.1)
Sodium: 139 mmol/L (ref 135–145)

## 2020-03-27 LAB — SURGICAL PCR SCREEN
MRSA, PCR: NEGATIVE
Staphylococcus aureus: NEGATIVE

## 2020-03-27 NOTE — Progress Notes (Signed)
BMP sent to Dr. Ninfa Linden to review.

## 2020-04-03 ENCOUNTER — Other Ambulatory Visit: Payer: Self-pay

## 2020-04-03 ENCOUNTER — Ambulatory Visit (HOSPITAL_COMMUNITY): Payer: 59

## 2020-04-03 ENCOUNTER — Ambulatory Visit (HOSPITAL_COMMUNITY): Payer: 59 | Admitting: Anesthesiology

## 2020-04-03 ENCOUNTER — Observation Stay (HOSPITAL_COMMUNITY)
Admission: RE | Admit: 2020-04-03 | Discharge: 2020-04-04 | Disposition: A | Discharge status: Home / Self Care | Payer: 59 | Attending: Orthopaedic Surgery | Admitting: Orthopaedic Surgery

## 2020-04-03 ENCOUNTER — Encounter (HOSPITAL_COMMUNITY): Payer: Self-pay | Admitting: Orthopaedic Surgery

## 2020-04-03 ENCOUNTER — Encounter (HOSPITAL_COMMUNITY)
Admission: RE | Disposition: A | Discharge status: Home / Self Care | Payer: Self-pay | Source: Home / Self Care | Attending: Orthopaedic Surgery

## 2020-04-03 ENCOUNTER — Ambulatory Visit (HOSPITAL_COMMUNITY): Payer: 59 | Admitting: Physician Assistant

## 2020-04-03 DIAGNOSIS — I10 Essential (primary) hypertension: Secondary | ICD-10-CM | POA: Diagnosis not present

## 2020-04-03 DIAGNOSIS — M25552 Pain in left hip: Secondary | ICD-10-CM | POA: Diagnosis present

## 2020-04-03 DIAGNOSIS — Z85828 Personal history of other malignant neoplasm of skin: Secondary | ICD-10-CM | POA: Diagnosis not present

## 2020-04-03 DIAGNOSIS — Z79899 Other long term (current) drug therapy: Secondary | ICD-10-CM | POA: Diagnosis not present

## 2020-04-03 DIAGNOSIS — E785 Hyperlipidemia, unspecified: Secondary | ICD-10-CM | POA: Diagnosis not present

## 2020-04-03 DIAGNOSIS — Z791 Long term (current) use of non-steroidal anti-inflammatories (NSAID): Secondary | ICD-10-CM | POA: Diagnosis not present

## 2020-04-03 DIAGNOSIS — M1612 Unilateral primary osteoarthritis, left hip: Principal | ICD-10-CM

## 2020-04-03 DIAGNOSIS — Z87891 Personal history of nicotine dependence: Secondary | ICD-10-CM | POA: Insufficient documentation

## 2020-04-03 DIAGNOSIS — Z96642 Presence of left artificial hip joint: Secondary | ICD-10-CM

## 2020-04-03 DIAGNOSIS — Z419 Encounter for procedure for purposes other than remedying health state, unspecified: Secondary | ICD-10-CM

## 2020-04-03 HISTORY — PX: TOTAL HIP ARTHROPLASTY: SHX124

## 2020-04-03 LAB — TYPE AND SCREEN
ABO/RH(D): A POS
Antibody Screen: NEGATIVE

## 2020-04-03 LAB — ABO/RH: ABO/RH(D): A POS

## 2020-04-03 SURGERY — ARTHROPLASTY, HIP, TOTAL, ANTERIOR APPROACH
Anesthesia: Monitor Anesthesia Care | Site: Hip | Laterality: Left

## 2020-04-03 MED ORDER — ONDANSETRON HCL 4 MG/2ML IJ SOLN
INTRAMUSCULAR | Status: DC | PRN
  Administered 2020-04-03: 4 mg via INTRAVENOUS

## 2020-04-03 MED ORDER — OXYCODONE HCL 5 MG PO TABS
10.0000 mg | ORAL_TABLET | ORAL | Status: DC | PRN
  Filled 2020-04-03: qty 2

## 2020-04-03 MED ORDER — LIDOCAINE 2% (20 MG/ML) 5 ML SYRINGE
INTRAMUSCULAR | Status: DC | PRN
  Administered 2020-04-03: 40 mg via INTRAVENOUS

## 2020-04-03 MED ORDER — MENTHOL 3 MG MT LOZG: 1.0000 | LOZENGE | OROMUCOSAL | Status: DC | PRN

## 2020-04-03 MED ORDER — METOCLOPRAMIDE HCL 5 MG/ML IJ SOLN
5.0000 mg | Freq: Three times a day (TID) | INTRAMUSCULAR | Status: DC | PRN
Start: 2020-04-03 — End: 2020-04-04

## 2020-04-03 MED ORDER — PANTOPRAZOLE SODIUM 40 MG PO TBEC
40.0000 mg | DELAYED_RELEASE_TABLET | Freq: Every day | ORAL | Status: DC
  Administered 2020-04-04: 40 mg via ORAL
  Filled 2020-04-03: qty 1

## 2020-04-03 MED ORDER — ORAL CARE MOUTH RINSE
15.0000 mL | Freq: Once | OROMUCOSAL | Status: AC
  Administered 2020-04-03: 15 mL via OROMUCOSAL

## 2020-04-03 MED ORDER — PROMETHAZINE HCL 25 MG/ML IJ SOLN: 6.2500 mg | INTRAMUSCULAR | Status: DC | PRN

## 2020-04-03 MED ORDER — METHOCARBAMOL 500 MG PO TABS
500.0000 mg | ORAL_TABLET | Freq: Four times a day (QID) | ORAL | Status: DC | PRN
  Administered 2020-04-03 – 2020-04-04 (×2): 500 mg via ORAL
  Filled 2020-04-03 (×2): qty 1

## 2020-04-03 MED ORDER — CEFAZOLIN SODIUM-DEXTROSE 1-4 GM/50ML-% IV SOLN
1.0000 g | Freq: Four times a day (QID) | INTRAVENOUS | Status: AC
  Administered 2020-04-03 (×2): 1 g via INTRAVENOUS
  Filled 2020-04-03 (×2): qty 50

## 2020-04-03 MED ORDER — HYDROCHLOROTHIAZIDE 12.5 MG PO CAPS
12.5000 mg | ORAL_CAPSULE | Freq: Every day | ORAL | Status: DC
  Administered 2020-04-04: 12.5 mg via ORAL
  Filled 2020-04-03: qty 1

## 2020-04-03 MED ORDER — ALUM & MAG HYDROXIDE-SIMETH 200-200-20 MG/5ML PO SUSP: 30.0000 mL | ORAL | Status: DC | PRN

## 2020-04-03 MED ORDER — METHOCARBAMOL 1000 MG/10ML IJ SOLN
500.0000 mg | Freq: Four times a day (QID) | INTRAVENOUS | Status: DC | PRN
  Filled 2020-04-03: qty 5

## 2020-04-03 MED ORDER — CHLORHEXIDINE GLUCONATE 0.12 % MT SOLN: 15.0000 mL | Freq: Once | OROMUCOSAL | Status: AC

## 2020-04-03 MED ORDER — PHENYLEPHRINE HCL (PRESSORS) 10 MG/ML IV SOLN
INTRAVENOUS | Status: AC
  Filled 2020-04-03: qty 4

## 2020-04-03 MED ORDER — HYDROMORPHONE HCL 1 MG/ML IJ SOLN
0.5000 mg | INTRAMUSCULAR | Status: DC | PRN
  Administered 2020-04-03 – 2020-04-04 (×2): 1 mg via INTRAVENOUS
  Filled 2020-04-03 (×2): qty 1

## 2020-04-03 MED ORDER — ONDANSETRON HCL 4 MG/2ML IJ SOLN: 4.0000 mg | Freq: Four times a day (QID) | INTRAMUSCULAR | Status: DC | PRN

## 2020-04-03 MED ORDER — PROPOFOL 500 MG/50ML IV EMUL
INTRAVENOUS | Status: DC | PRN
  Administered 2020-04-03: 95 ug/kg/min via INTRAVENOUS

## 2020-04-03 MED ORDER — ACETAMINOPHEN 10 MG/ML IV SOLN: 1000.0000 mg | Freq: Once | INTRAVENOUS | Status: DC | PRN

## 2020-04-03 MED ORDER — LACTATED RINGERS IV SOLN: INTRAVENOUS | Status: DC

## 2020-04-03 MED ORDER — CEFAZOLIN SODIUM-DEXTROSE 2-4 GM/100ML-% IV SOLN
2.0000 g | INTRAVENOUS | Status: AC
  Administered 2020-04-03: 2 g via INTRAVENOUS
  Filled 2020-04-03: qty 100

## 2020-04-03 MED ORDER — DIPHENHYDRAMINE HCL 12.5 MG/5ML PO ELIX: 12.5000 mg | ORAL_SOLUTION | ORAL | Status: DC | PRN

## 2020-04-03 MED ORDER — MIDAZOLAM HCL 5 MG/5ML IJ SOLN
INTRAMUSCULAR | Status: DC | PRN
  Administered 2020-04-03: 2 mg via INTRAVENOUS

## 2020-04-03 MED ORDER — DEXAMETHASONE SODIUM PHOSPHATE 10 MG/ML IJ SOLN
INTRAMUSCULAR | Status: DC | PRN
  Administered 2020-04-03: 8 mg via INTRAVENOUS

## 2020-04-03 MED ORDER — ONDANSETRON HCL 4 MG PO TABS: 4.0000 mg | ORAL_TABLET | Freq: Four times a day (QID) | ORAL | Status: DC | PRN

## 2020-04-03 MED ORDER — ACETAMINOPHEN 325 MG PO TABS
325.0000 mg | ORAL_TABLET | Freq: Four times a day (QID) | ORAL | Status: DC | PRN
  Administered 2020-04-04: 650 mg via ORAL
  Filled 2020-04-03: qty 2

## 2020-04-03 MED ORDER — OXYCODONE HCL 5 MG PO TABS
5.0000 mg | ORAL_TABLET | ORAL | Status: DC | PRN
  Administered 2020-04-03 (×2): 10 mg via ORAL
  Administered 2020-04-03: 5 mg via ORAL
  Administered 2020-04-04: 10 mg via ORAL
  Filled 2020-04-03 (×3): qty 2

## 2020-04-03 MED ORDER — 0.9 % SODIUM CHLORIDE (POUR BTL) OPTIME
TOPICAL | Status: DC | PRN
  Administered 2020-04-03: 400 mL

## 2020-04-03 MED ORDER — HYDROMORPHONE HCL 1 MG/ML IJ SOLN: 0.2500 mg | INTRAMUSCULAR | Status: DC | PRN

## 2020-04-03 MED ORDER — SODIUM CHLORIDE 0.9 % IR SOLN
Status: DC | PRN
  Administered 2020-04-03: 750 mL

## 2020-04-03 MED ORDER — PROPOFOL 1000 MG/100ML IV EMUL
INTRAVENOUS | Status: AC
  Filled 2020-04-03: qty 200

## 2020-04-03 MED ORDER — POVIDONE-IODINE 10 % EX SWAB
2.0000 "application " | Freq: Once | CUTANEOUS | Status: AC
  Administered 2020-04-03: 2 via TOPICAL

## 2020-04-03 MED ORDER — PHENYLEPHRINE HCL-NACL 10-0.9 MG/250ML-% IV SOLN
INTRAVENOUS | Status: DC | PRN
  Administered 2020-04-03: 25 ug/min via INTRAVENOUS

## 2020-04-03 MED ORDER — BUPIVACAINE IN DEXTROSE 0.75-8.25 % IT SOLN
INTRATHECAL | Status: DC | PRN
  Administered 2020-04-03: 1.8 mL via INTRATHECAL

## 2020-04-03 MED ORDER — PHENOL 1.4 % MT LIQD: 1.0000 | OROMUCOSAL | Status: DC | PRN

## 2020-04-03 MED ORDER — MIDAZOLAM HCL 2 MG/2ML IJ SOLN
INTRAMUSCULAR | Status: AC
  Filled 2020-04-03: qty 2

## 2020-04-03 MED ORDER — PROPOFOL 10 MG/ML IV BOLUS
INTRAVENOUS | Status: DC | PRN
  Administered 2020-04-03 (×2): 10 mg via INTRAVENOUS

## 2020-04-03 MED ORDER — GLYCOPYRROLATE 0.2 MG/ML IJ SOLN
INTRAMUSCULAR | Status: DC | PRN
  Administered 2020-04-03: .2 mg via INTRAVENOUS

## 2020-04-03 MED ORDER — FENTANYL CITRATE (PF) 100 MCG/2ML IJ SOLN
INTRAMUSCULAR | Status: DC | PRN
  Administered 2020-04-03: 50 ug via INTRAVENOUS

## 2020-04-03 MED ORDER — ROSUVASTATIN CALCIUM 5 MG PO TABS: 5.0000 mg | ORAL_TABLET | ORAL | Status: DC

## 2020-04-03 MED ORDER — SODIUM CHLORIDE 0.9 % IV SOLN: INTRAVENOUS | Status: DC

## 2020-04-03 MED ORDER — FENTANYL CITRATE (PF) 100 MCG/2ML IJ SOLN
INTRAMUSCULAR | Status: AC
  Filled 2020-04-03: qty 2

## 2020-04-03 MED ORDER — TRANEXAMIC ACID-NACL 1000-0.7 MG/100ML-% IV SOLN
1000.0000 mg | INTRAVENOUS | Status: AC
  Administered 2020-04-03: 1000 mg via INTRAVENOUS
  Filled 2020-04-03: qty 100

## 2020-04-03 MED ORDER — METOCLOPRAMIDE HCL 5 MG PO TABS: 5.0000 mg | ORAL_TABLET | Freq: Three times a day (TID) | ORAL | Status: DC | PRN

## 2020-04-03 MED ORDER — ASPIRIN 81 MG PO CHEW
81.0000 mg | CHEWABLE_TABLET | Freq: Two times a day (BID) | ORAL | Status: DC
  Administered 2020-04-03 – 2020-04-04 (×2): 81 mg via ORAL
  Filled 2020-04-03 (×2): qty 1

## 2020-04-03 MED ORDER — DOCUSATE SODIUM 100 MG PO CAPS
100.0000 mg | ORAL_CAPSULE | Freq: Two times a day (BID) | ORAL | Status: DC
  Administered 2020-04-03 – 2020-04-04 (×2): 100 mg via ORAL
  Filled 2020-04-03 (×2): qty 1

## 2020-04-03 SURGICAL SUPPLY — 38 items
BAG ZIPLOCK 12X15 (MISCELLANEOUS) ×2 IMPLANT
BENZOIN TINCTURE PRP APPL 2/3 (GAUZE/BANDAGES/DRESSINGS) ×2 IMPLANT
BLADE SAW SGTL 18X1.27X75 (BLADE) ×2 IMPLANT
COVER PERINEAL POST (MISCELLANEOUS) ×2 IMPLANT
COVER SURGICAL LIGHT HANDLE (MISCELLANEOUS) ×2 IMPLANT
COVER WAND RF STERILE (DRAPES) IMPLANT
CUP ACET PNNCL SECTR W/GRIP 56 (Hips) ×1 IMPLANT
DRAPE STERI IOBAN 125X83 (DRAPES) ×2 IMPLANT
DRAPE U-SHAPE 47X51 STRL (DRAPES) ×4 IMPLANT
DRSG AQUACEL AG ADV 3.5X10 (GAUZE/BANDAGES/DRESSINGS) ×2 IMPLANT
DURAPREP 26ML APPLICATOR (WOUND CARE) ×2 IMPLANT
ELECT REM PT RETURN 15FT ADLT (MISCELLANEOUS) ×2 IMPLANT
GAUZE XEROFORM 1X8 LF (GAUZE/BANDAGES/DRESSINGS) ×2 IMPLANT
GLOVE SRG 8 PF TXTR STRL LF DI (GLOVE) ×2 IMPLANT
GLOVE SURG ENC MOIS LTX SZ7.5 (GLOVE) ×2 IMPLANT
GLOVE SURG LTX SZ8 (GLOVE) ×2 IMPLANT
GLOVE SURG UNDER POLY LF SZ8 (GLOVE) ×2
GOWN STRL REUS W/TWL XL LVL3 (GOWN DISPOSABLE) ×4 IMPLANT
HANDPIECE INTERPULSE COAX TIP (DISPOSABLE) ×1
HEAD CERAMIC DELTA 36 PLUS 1.5 (Hips) ×2 IMPLANT
HOLDER FOLEY CATH W/STRAP (MISCELLANEOUS) ×2 IMPLANT
KIT TURNOVER KIT A (KITS) ×2 IMPLANT
LINER NEUTRAL 52MMX36MMX56N (Liner) ×2 IMPLANT
PACK ANTERIOR HIP CUSTOM (KITS) ×2 IMPLANT
PENCIL SMOKE EVACUATOR (MISCELLANEOUS) ×2 IMPLANT
PINN SECTOR W/GRIP ACE CUP 56 (Hips) ×2 IMPLANT
SET HNDPC FAN SPRY TIP SCT (DISPOSABLE) ×1 IMPLANT
STAPLER VISISTAT 35W (STAPLE) IMPLANT
STEM AMT HIGH CORAIL SZ13X135 (Stem) ×2 IMPLANT
STRIP CLOSURE SKIN 1/2X4 (GAUZE/BANDAGES/DRESSINGS) ×2 IMPLANT
SUT ETHIBOND NAB CT1 #1 30IN (SUTURE) ×2 IMPLANT
SUT ETHILON 2 0 PS N (SUTURE) IMPLANT
SUT MNCRL AB 4-0 PS2 18 (SUTURE) IMPLANT
SUT VIC AB 0 CT1 36 (SUTURE) ×2 IMPLANT
SUT VIC AB 1 CT1 36 (SUTURE) ×2 IMPLANT
SUT VIC AB 2-0 CT1 27 (SUTURE) ×2
SUT VIC AB 2-0 CT1 TAPERPNT 27 (SUTURE) ×2 IMPLANT
TRAY FOLEY MTR SLVR 16FR STAT (SET/KITS/TRAYS/PACK) ×2 IMPLANT

## 2020-04-03 NOTE — Evaluation (Signed)
Physical Therapy Evaluation Patient Details Name: Randy Bell MRN: [id number] DOB: February 25, 1970 Today's Date: 04/03/2020   History of Present Illness  Patient is 50 y.o. male s/p Lt THA anterior approach on 04/03/20 with PMH significant for HTN, OA.  Clinical Impression  Pt is a 49y.o. male s/p Lt THA POD 0. Pt reports that he is independent with mobility at baseline. Pt required MIN guard with cues for safe hand placement for sit to stand transfer. Pt required MIN assist progressing to MIN guard-supervision for safety with ambulation 161ft and step through gait pattern with no LOB. PT reviewed therapeutic intervention for promotion of DVT prevention, pt demonstrated understanding. Pt will have assist from his wife upon discharge. Pt will benefit from skilled PT to increase independence and safety with mobility. Acute therapy to follow up during stay to progress functional mobility as able to ensure safe dischare home.       Follow Up Recommendations Home health PT;Follow surgeon's recommendation for DC plan and follow-up therapies    Equipment Recommendations  None recommended by PT (pt owns RW)    Recommendations for Other Services       Precautions / Restrictions Precautions Precautions: Fall Restrictions Weight Bearing Restrictions: No LLE Weight Bearing: Weight bearing as tolerated      Mobility  Bed Mobility Overal bed mobility: Needs Assistance Bed Mobility: Supine to Sit     Supine to sit: Supervision;HOB elevated     General bed mobility comments: supervision for safety with use of B UEs to scoot to EOB.    Transfers Overall transfer level: Needs assistance Equipment used: Rolling walker (2 wheeled) Transfers: Sit to/from Stand Sit to Stand: Min guard;From elevated surface         General transfer comment: MIN guard for safety with cues for safe hand placement  Ambulation/Gait Ambulation/Gait assistance: Min assist;Min guard;Supervision Gait Distance  (Feet): 170 Feet Assistive device: Rolling walker (2 wheeled) Gait Pattern/deviations: Step-through pattern;Decreased weight shift to left     General Gait Details: Pt performed pre gait marching with use of B UEs on RW and MIN assist from therapist for safety, MIN assist progressing to MIN guard-supervision with cues for step to gait pattern, pt quickly progressing to step through with no LOB.  Stairs            Wheelchair Mobility    Modified Rankin (Stroke Patients Only)       Balance Overall balance assessment: Needs assistance Sitting-balance support: Feet supported Sitting balance-Leahy Scale: Good     Standing balance support: Bilateral upper extremity supported;During functional activity Standing balance-Leahy Scale: Poor Standing balance comment: use of RW                             Pertinent Vitals/Pain Pain Assessment: 0-10 Pain Score: 3  Pain Location: Lt hip Pain Descriptors / Indicators: Sore Pain Intervention(s): Limited activity within patient's tolerance;Monitored during session;Repositioned;Ice applied    Home Living Family/patient expects to be discharged to:: Private residence Living Arrangements: Spouse/significant other;Children Available Help at Discharge: Family Type of Home: House Home Access: Stairs to enter Entrance Stairs-Rails: None Entrance Stairs-Number of Steps: 2 Home Layout: Two level Home Equipment: Crutches;Toilet riser;Walker - 2 wheels Additional Comments: pt's bedroom is on main level. Wife works from home and can provide assist.    Prior Function Level of Independence: Independent               Hand Dominance  Dominant Hand: Left    Extremity/Trunk Assessment   Upper Extremity Assessment Upper Extremity Assessment: Overall WFL for tasks assessed    Lower Extremity Assessment Lower Extremity Assessment: LLE deficits/detail LLE Deficits / Details: pt with good Lt quad set strength and 4+/5 B  dorsi/plantar flexion strength LLE Sensation: WNL LLE Coordination: WNL    Cervical / Trunk Assessment Cervical / Trunk Assessment: Normal  Communication   Communication: No difficulties  Cognition Arousal/Alertness: Awake/alert Behavior During Therapy: WFL for tasks assessed/performed Overall Cognitive Status: Within Functional Limits for tasks assessed                                        General Comments      Exercises Total Joint Exercises Ankle Circles/Pumps: AROM;Both;20 reps;Seated Heel Slides: AROM;Left;5 reps;Seated   Assessment/Plan    PT Assessment Patient needs continued PT services  PT Problem List Decreased strength;Decreased range of motion;Decreased activity tolerance;Decreased balance;Decreased mobility;Decreased knowledge of use of DME;Pain       PT Treatment Interventions DME instruction;Gait training;Functional mobility training;Stair training;Therapeutic activities;Therapeutic exercise;Balance training;Patient/family education    PT Goals (Current goals can be found in the Care Plan section)  Acute Rehab PT Goals Patient Stated Goal: get back to participating in  water sports PT Goal Formulation: With patient Time For Goal Achievement: 04/10/20 Potential to Achieve Goals: Good    Frequency 7X/week   Barriers to discharge        Co-evaluation               AM-PAC PT "6 Clicks" Mobility  Outcome Measure Help needed turning from your back to your side while in a flat bed without using bedrails?: None Help needed moving from lying on your back to sitting on the side of a flat bed without using bedrails?: None Help needed moving to and from a bed to a chair (including a wheelchair)?: A Little Help needed standing up from a chair using your arms (e.g., wheelchair or bedside chair)?: A Little Help needed to walk in hospital room?: A Little Help needed climbing 3-5 steps with a railing? : A Little 6 Click Score: 20    End  of Session Equipment Utilized During Treatment: Gait belt Activity Tolerance: Patient tolerated treatment well Patient left: in chair;with call bell/phone within reach;with chair alarm set Nurse Communication: Mobility status PT Visit Diagnosis: Unsteadiness on feet (R26.81);Muscle weakness (generalized) (M62.81);Pain Pain - Right/Left: Left Pain - part of body: Hip    Time: 1017-5102 PT Time Calculation (min) (ACUTE ONLY): 18 min   Charges:              Elna Breslow, SPT  Acute rehab    Elna Breslow 04/03/2020, 4:56 PM

## 2020-04-03 NOTE — Progress Notes (Signed)
Patient ID: Randy Bell, male   DOB: [birth year], 50 y.o.   MRN: [id number] I reviewed the patient's x-rays myself.  His left total hip arthroplasty looks good and in good position.  What the radiologist is saying it is just residual osteophytes from the severity of his arthritis around the acetabulum.  This is not worrisome.  The patient can weight-bear as tolerated.

## 2020-04-03 NOTE — Brief Op Note (Signed)
04/03/2020  11:24 AM  PATIENT:  Rogelia Mire  50 y.o. male  PRE-OPERATIVE DIAGNOSIS:  osteoarthritis left hip  POST-OPERATIVE DIAGNOSIS:  osteoarthritis left hip  PROCEDURE:  Procedure(s): LEFT TOTAL HIP ARTHROPLASTY ANTERIOR APPROACH (Left)  SURGEON:  Surgeon(s) and Role:    Mcarthur Rossetti, MD - Primary  PHYSICIAN ASSISTANT:  Benita Stabile, PA-C  ANESTHESIA:   spinal  EBL:  100 mL   COUNTS:  YES  DICTATION: .Other Dictation: Dictation Number [id number]  PLAN OF CARE: Admit for overnight observation  PATIENT DISPOSITION:  PACU - hemodynamically stable.   Delay start of Pharmacological VTE agent (>24hrs) due to surgical blood loss or risk of bleeding: no

## 2020-04-03 NOTE — Anesthesia Preprocedure Evaluation (Signed)
Anesthesia Evaluation  Patient identified by MRN, date of birth, ID band Patient awake    Reviewed: Allergy & Precautions, NPO status , Patient's Chart, lab work & pertinent test results  Airway Mallampati: II  TM Distance: >3 FB Neck ROM: Full    Dental  (+) Teeth Intact   Pulmonary neg pulmonary ROS, former smoker,    Pulmonary exam normal        Cardiovascular hypertension,  Rhythm:Regular Rate:Normal     Neuro/Psych negative neurological ROS  negative psych ROS   GI/Hepatic negative GI ROS, Neg liver ROS,   Endo/Other  negative endocrine ROS  Renal/GU negative Renal ROS  negative genitourinary   Musculoskeletal  (+) Arthritis , Osteoarthritis,    Abdominal (+)  Abdomen: soft. Bowel sounds: normal.  Peds  Hematology negative hematology ROS (+)   Anesthesia Other Findings   Reproductive/Obstetrics                             Anesthesia Physical Anesthesia Plan  ASA: II  Anesthesia Plan: MAC and Spinal   Post-op Pain Management:    Induction: Intravenous  PONV Risk Score and Plan: 1 and Ondansetron, Dexamethasone, Propofol infusion and Treatment may vary due to age or medical condition  Airway Management Planned: Simple Face Mask, Natural Airway and Nasal Cannula  Additional Equipment: None  Intra-op Plan:   Post-operative Plan:   Informed Consent: I have reviewed the patients History and Physical, chart, labs and discussed the procedure including the risks, benefits and alternatives for the proposed anesthesia with the patient or authorized representative who has indicated his/her understanding and acceptance.     Dental advisory given  Plan Discussed with: CRNA  Anesthesia Plan Comments: (Lab Results      Component                Value               Date                      WBC                      7.6                 03/27/2020                HGB                       15.0                03/27/2020                HCT                      45.0                03/27/2020                MCV                      90.9                03/27/2020                PLT  273                 03/27/2020           Lab Results      Component                Value               Date                      NA                       139                 03/27/2020                K                        4.8                 03/27/2020                CO2                      27                  03/27/2020                GLUCOSE                  94                  03/27/2020                BUN                      22 (H)              03/27/2020                CREATININE               1.52 (H)            03/27/2020                CALCIUM                  10.3                03/27/2020                GFRNONAA                 55 (L)              03/27/2020                GFRAA                    83                  09/19/2018          )        Anesthesia Quick Evaluation

## 2020-04-03 NOTE — Anesthesia Postprocedure Evaluation (Signed)
Anesthesia Post Note  Patient: Randy Bell  Procedure(s) Performed: LEFT TOTAL HIP ARTHROPLASTY ANTERIOR APPROACH (Left Hip)     Patient location during evaluation: PACU Anesthesia Type: Spinal Level of consciousness: oriented and awake and alert Pain management: pain level controlled Vital Signs Assessment: post-procedure vital signs reviewed and stable Respiratory status: spontaneous breathing, respiratory function stable and patient connected to nasal cannula oxygen Cardiovascular status: blood pressure returned to baseline and stable Postop Assessment: no headache, no backache and no apparent nausea or vomiting Anesthetic complications: no   No complications documented.  Last Vitals:  Vitals:   04/03/20 1230 04/03/20 1245  BP: 106/74 (!) 101/57  Pulse: (!) 42 (!) 42  Resp: 13 13  Temp:  (!) 36.4 C  SpO2: 100% 100%    Last Pain:  Vitals:   04/03/20 1230  TempSrc:   PainSc: 2                  Jameisha Stofko S

## 2020-04-03 NOTE — H&P (Signed)
TOTAL HIP ADMISSION H&P  Patient is admitted for left total hip arthroplasty.  Subjective:  Chief Complaint: left hip pain  HPI: Randy Bell, 50 y.o. male, has a history of pain and functional disability in the left hip(s) due to arthritis and patient has failed non-surgical conservative treatments for greater than 12 weeks to include NSAID's and/or analgesics, corticosteriod injections, flexibility and strengthening excercises and activity modification.  Onset of symptoms was gradual starting 5 years ago with gradually worsening course since that time.The patient noted no past surgery on the left hip(s).  Patient currently rates pain in the left hip at 10 out of 10 with activity. Patient has night pain, worsening of pain with activity and weight bearing, trendelenberg gait, pain that interfers with activities of daily living and pain with passive range of motion. Patient has evidence of subchondral sclerosis, periarticular osteophytes and joint space narrowing by imaging studies. This condition presents safety issues increasing the risk of falls.  There is no current active infection.  Patient Active Problem List   Diagnosis Date Noted  . Episodic lightheadedness 04/26/2019  . Elevated blood pressure reading without diagnosis of hypertension 10/05/2018  . Hyperlipidemia 10/05/2018  . Family history of stroke or transient ischemic attack in father 08/01/2018  . Family history of hyperlipidemia- Mom and Dad 08/01/2018  . Bradycardia, unspecified 08/01/2018  . Fluttering sensation of heart- with rest and lasts 2-3 sec 08/01/2018  . 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 08/01/2018  . Primary osteoarthritis of left hip 05/16/2018   Past Medical History:  Diagnosis Date  . Arthritis   . Basal cell carcinoma    Neck  . Dizziness   . Hypertension     Past Surgical History:  Procedure Laterality Date  . BASAL CELL CARCINOMA EXCISION     Neck  . FOOT SURGERY      Current  Facility-Administered Medications  Medication Dose Route Frequency Provider Last Rate Last Admin  . ceFAZolin (ANCEF) IVPB 2g/100 mL premix  2 g Intravenous On Call to OR Pete Pelt, PA-C      . lactated ringers infusion   Intravenous Continuous Effie Berkshire, MD 10 mL/hr at 04/03/20 0808 New Bag at 04/03/20 0932  . tranexamic acid (CYKLOKAPRON) IVPB 1,000 mg  1,000 mg Intravenous To OR Pete Pelt, PA-C       No Known Allergies  Social History   Tobacco Use  . Smoking status: Former Smoker    Years: 3.00    Types: Cigarettes  . Smokeless tobacco: Never Used  . Tobacco comment: quit 30 years   Substance Use Topics  . Alcohol use: Yes    Alcohol/week: 6.0 standard drinks    Types: 6 Standard drinks or equivalent per week    Family History  Problem Relation Age of Onset  . Hyperlipidemia Mother   . Hyperlipidemia Father   . Stroke Father      Review of Systems  All other systems reviewed and are negative.   Objective:  Physical Exam Vitals reviewed.  Constitutional:      Appearance: Normal appearance.  HENT:     Head: Normocephalic and atraumatic.  Eyes:     Extraocular Movements: Extraocular movements intact.     Pupils: Pupils are equal, round, and reactive to light.  Cardiovascular:     Rate and Rhythm: Normal rate and regular rhythm.     Pulses: Normal pulses.  Pulmonary:     Effort: Pulmonary effort is normal.     Breath sounds:  Normal breath sounds.  Abdominal:     Palpations: Abdomen is soft.  Musculoskeletal:     Cervical back: Normal range of motion and neck supple.     Left hip: Tenderness and bony tenderness present. Decreased range of motion. Decreased strength.  Neurological:     Mental Status: He is alert and oriented to person, place, and time.  Psychiatric:        Behavior: Behavior normal.     Vital signs in last 24 hours: Temp:  [98.3 F (36.8 C)] 98.3 F (36.8 C) (03/10 0808) Pulse Rate:  [63] 63 (03/10 0808) Resp:  [16]  16 (03/10 0808) BP: (162)/(103) 162/103 (03/10 0808) SpO2:  [100 %] 100 % (03/10 0808)  Labs:   Estimated body mass index is 27.99 kg/m as calculated from the following:   Height as of 03/27/20: 6' (1.829 m).   Weight as of 03/27/20: 93.6 kg.   Imaging Review Plain radiographs demonstrate severe degenerative joint disease of the left hip(s). The bone quality appears to be excellent for age and reported activity level.      Assessment/Plan:  End stage arthritis, left hip(s)  The patient history, physical examination, clinical judgement of the provider and imaging studies are consistent with end stage degenerative joint disease of the left hip(s) and total hip arthroplasty is deemed medically necessary. The treatment options including medical management, injection therapy, arthroscopy and arthroplasty were discussed at length. The risks and benefits of total hip arthroplasty were presented and reviewed. The risks due to aseptic loosening, infection, stiffness, dislocation/subluxation,  thromboembolic complications and other imponderables were discussed.  The patient acknowledged the explanation, agreed to proceed with the plan and consent was signed. Patient is being admitted for inpatient treatment for surgery, pain control, PT, OT, prophylactic antibiotics, VTE prophylaxis, progressive ambulation and ADL's and discharge planning.The patient is planning to be discharged home with home health services

## 2020-04-03 NOTE — Progress Notes (Signed)
Radiology dept called this nurse to review and notify MD of hip xray. MD Ninfa Linden notified. No new orders placed.

## 2020-04-03 NOTE — Anesthesia Procedure Notes (Signed)
Spinal  Patient location during procedure: OR Start time: 04/03/2020 10:10 AM End time: 04/03/2020 10:11 AM Staffing Performed: anesthesiologist  Anesthesiologist: Darral Dash, DO Preanesthetic Checklist Completed: patient identified, IV checked, site marked, risks and benefits discussed, surgical consent, monitors and equipment checked, pre-op evaluation and timeout performed Spinal Block Patient position: sitting Prep: DuraPrep Patient monitoring: heart rate, cardiac monitor, continuous pulse ox and blood pressure Approach: midline Location: L4-5 Injection technique: single-shot Needle Needle type: Pencan  Needle gauge: 24 G Needle length: 10 cm Additional Notes Patient identified. Risks/Benefits/Options discussed with patient including but not limited to bleeding, infection, nerve damage, paralysis, failed block, incomplete pain control, headache, blood pressure changes, nausea, vomiting, reactions to medications, itching and postpartum back pain. Confirmed with bedside nurse the patient's most recent platelet count. Confirmed with patient that they are not currently taking any anticoagulation, have any bleeding history or any family history of bleeding disorders. Patient expressed understanding and wished to proceed. All questions were answered. Sterile technique was used throughout the entire procedure. Please see nursing notes for vital signs. Warning signs of high block given to the patient including shortness of breath, tingling/numbness in hands, complete motor block, or any concerning symptoms with instructions to call for help. Patient was given instructions on fall risk and not to get out of bed. All questions and concerns addressed with instructions to call with any issues or inadequate analgesia.

## 2020-04-03 NOTE — Plan of Care (Signed)

## 2020-04-03 NOTE — Transfer of Care (Signed)
Immediate Anesthesia Transfer of Care Note  Patient: Randy Bell  Procedure(s) Performed: Procedure(s): LEFT TOTAL HIP ARTHROPLASTY ANTERIOR APPROACH (Left)  Patient Location: PACU  Anesthesia Type:Spinal  Level of Consciousness:  sedated, patient cooperative and responds to stimulation  Airway & Oxygen Therapy:Patient Spontanous Breathing and Patient connected to face mask oxgen  Post-op Assessment:  Report given to PACU RN and Post -op Vital signs reviewed and stable  Post vital signs:  Reviewed and stable  Last Vitals:  Vitals:   04/03/20 0808 04/03/20 1145  BP: (!) 162/103 (!) 102/58  Pulse: 63 65  Resp: 16 14  Temp: 36.8 C 36.4 C  SpO2: 196% 22%    Complications: No apparent anesthesia complications

## 2020-04-04 DIAGNOSIS — M1612 Unilateral primary osteoarthritis, left hip: Secondary | ICD-10-CM | POA: Diagnosis not present

## 2020-04-04 LAB — CBC
HCT: 36.3 % — ABNORMAL LOW (ref 39.0–52.0)
Hemoglobin: 12.1 g/dL — ABNORMAL LOW (ref 13.0–17.0)
MCH: 30.1 pg (ref 26.0–34.0)
MCHC: 33.3 g/dL (ref 30.0–36.0)
MCV: 90.3 fL (ref 80.0–100.0)
Platelets: 236 10*3/uL (ref 150–400)
RBC: 4.02 MIL/uL — ABNORMAL LOW (ref 4.22–5.81)
RDW: 12.5 % (ref 11.5–15.5)
WBC: 11.9 10*3/uL — ABNORMAL HIGH (ref 4.0–10.5)
nRBC: 0 % (ref 0.0–0.2)

## 2020-04-04 LAB — BASIC METABOLIC PANEL
Anion gap: 9 (ref 5–15)
BUN: 20 mg/dL (ref 6–20)
CO2: 23 mmol/L (ref 22–32)
Calcium: 8.9 mg/dL (ref 8.9–10.3)
Chloride: 103 mmol/L (ref 98–111)
Creatinine, Ser: 1.08 mg/dL (ref 0.61–1.24)
GFR, Estimated: >60 mL/min (ref >60)
Glucose, Bld: 124 mg/dL — ABNORMAL HIGH (ref 70–99)
Potassium: 4 mmol/L (ref 3.5–5.1)
Sodium: 135 mmol/L (ref 135–145)

## 2020-04-04 MED ORDER — ASPIRIN 81 MG PO CHEW: 81.0000 mg | CHEWABLE_TABLET | Freq: Two times a day (BID) | ORAL | 0 refills | Status: DC

## 2020-04-04 MED ORDER — OXYCODONE HCL 5 MG PO TABS: 5.0000 mg | ORAL_TABLET | Freq: Four times a day (QID) | ORAL | 0 refills | Status: DC | PRN

## 2020-04-04 MED ORDER — IBUPROFEN 200 MG PO TABS: 800.0000 mg | ORAL_TABLET | Freq: Three times a day (TID) | ORAL | 0 refills | Status: DC | PRN

## 2020-04-04 MED ORDER — METHOCARBAMOL 500 MG PO TABS: 500.0000 mg | ORAL_TABLET | Freq: Four times a day (QID) | ORAL | 1 refills | Status: DC | PRN

## 2020-04-04 NOTE — TOC Transition Note (Signed)
Transition of Care [facility]) - CM/SW Discharge Note   Patient Details  Name: Randy Bell MRN: [id number] Date of Birth: February 06, 1970  [facility]) CM/SW Contact:  Lia Hopping, [place] Phone Number: 04/04/2020, 9:25 AM   Clinical Narrative:    Prearranged Therapy Plan: HHPT [facility] now [place].  Nurse staff confirm with the patient he has DME  Final next level of care: [place] Barriers to Discharge: No Barriers Identified   Patient Goals and CMS Choice        Discharge Placement                       Discharge Plan and Services                          HH Arranged: PT [place] Agency: [facility] ([facility] Date [place]: 04/04/20 Time [place]: [phone] Representative spoke with at [place]: [place] ([place]) Interventions     Readmission Risk Interventions No flowsheet data found.

## 2020-04-04 NOTE — Discharge Instructions (Signed)
INSTRUCTIONS AFTER JOINT REPLACEMENT   o Remove items at home which could result in a fall. This includes throw rugs or furniture in walking pathways o ICE to the affected joint every three hours while awake for 30 minutes at a time, for at least the first 3-5 days, and then as needed for pain and swelling.  Continue to use ice for pain and swelling. You may notice swelling that will progress down to the foot and ankle.  This is normal after surgery.  Elevate your leg when you are not up walking on it.   o Continue to use the breathing machine you got in the hospital (incentive spirometer) which will help keep your temperature down.  It is common for your temperature to cycle up and down following surgery, especially at night when you are not up moving around and exerting yourself.  The breathing machine keeps your lungs expanded and your temperature down.   DIET:  As you were doing prior to hospitalization, we recommend a well-balanced diet.  DRESSING / WOUND CARE / SHOWERING  Keep the surgical dressing until follow up.  The dressing is water proof, so you can shower without any extra covering.  IF THE DRESSING FALLS OFF or the wound gets wet inside, change the dressing with sterile gauze.  Please use good hand washing techniques before changing the dressing.  Do not use any lotions or creams on the incision until instructed by your surgeon.    ACTIVITY  o Increase activity slowly as tolerated, but follow the weight bearing instructions below.   o No driving for 6 weeks or until further direction given by your physician.  You cannot drive while taking narcotics.  o No lifting or carrying greater than 10 lbs. until further directed by your surgeon. o Avoid periods of inactivity such as sitting longer than an hour when not asleep. This helps prevent blood clots.  o You may return to work once you are authorized by your doctor.     WEIGHT BEARING   Weight bearing as tolerated with assist  device (walker, cane, etc) as directed, use it as long as suggested by your surgeon or therapist, typically at least 4-6 weeks.   EXERCISES  Results after joint replacement surgery are often greatly improved when you follow the exercise, range of motion and muscle strengthening exercises prescribed by your doctor. Safety measures are also important to protect the joint from further injury. Any time any of these exercises cause you to have increased pain or swelling, decrease what you are doing until you are comfortable again and then slowly increase them. If you have problems or questions, call your caregiver or physical therapist for advice.   Rehabilitation is important following a joint replacement. After just a few days of immobilization, the muscles of the leg can become weakened and shrink (atrophy).  These exercises are designed to build up the tone and strength of the thigh and leg muscles and to improve motion. Often times heat used for twenty to thirty minutes before working out will loosen up your tissues and help with improving the range of motion but do not use heat for the first two weeks following surgery (sometimes heat can increase post-operative swelling).   These exercises can be done on a training (exercise) mat, on the floor, on a table or on a bed. Use whatever works the best and is most comfortable for you.    Use music or television while you are exercising so that   the exercises are a pleasant break in your day. This will make your life better with the exercises acting as a break in your routine that you can look forward to.   Perform all exercises about fifteen times, three times per day or as directed.  You should exercise both the operative leg and the other leg as well.  Exercises include:   . Quad Sets - Tighten up the muscle on the front of the thigh (Quad) and hold for 5-10 seconds.   . Straight Leg Raises - With your knee straight (if you were given a brace, keep it on),  lift the leg to 60 degrees, hold for 3 seconds, and slowly lower the leg.  Perform this exercise against resistance later as your leg gets stronger.  . Leg Slides: Lying on your back, slowly slide your foot toward your buttocks, bending your knee up off the floor (only go as far as is comfortable). Then slowly slide your foot back down until your leg is flat on the floor again.  . Angel Wings: Lying on your back spread your legs to the side as far apart as you can without causing discomfort.  . Hamstring Strength:  Lying on your back, push your heel against the floor with your leg straight by tightening up the muscles of your buttocks.  Repeat, but this time bend your knee to a comfortable angle, and push your heel against the floor.  You may put a pillow under the heel to make it more comfortable if necessary.   A rehabilitation program following joint replacement surgery can speed recovery and prevent re-injury in the future due to weakened muscles. Contact your doctor or a physical therapist for more information on knee rehabilitation.    CONSTIPATION  Constipation is defined medically as fewer than three stools per week and severe constipation as less than one stool per week.  Even if you have a regular bowel pattern at home, your normal regimen is likely to be disrupted due to multiple reasons following surgery.  Combination of anesthesia, postoperative narcotics, change in appetite and fluid intake all can affect your bowels.   YOU MUST use at least one of the following options; they are listed in order of increasing strength to get the job done.  They are all available over the counter, and you may need to use some, POSSIBLY even all of these options:    Drink plenty of fluids (prune juice may be helpful) and high fiber foods Colace 100 mg by mouth twice a day  Senokot for constipation as directed and as needed Dulcolax (bisacodyl), take with full glass of water  Miralax (polyethylene glycol)  once or twice a day as needed.  If you have tried all these things and are unable to have a bowel movement in the first 3-4 days after surgery call either your surgeon or your primary doctor.    If you experience loose stools or diarrhea, hold the medications until you stool forms back up.  If your symptoms do not get better within 1 week or if they get worse, check with your doctor.  If you experience "the worst abdominal pain ever" or develop nausea or vomiting, please contact the office immediately for further recommendations for treatment.   ITCHING:  If you experience itching with your medications, try taking only a single pain pill, or even half a pain pill at a time.  You can also use Benadryl over the counter for itching or also to   help with sleep.   TED HOSE STOCKINGS:  Use stockings on both legs until for at least 2 weeks or as directed by physician office. They may be removed at night for sleeping.  MEDICATIONS:  See your medication summary on the "After Visit Summary" that nursing will review with you.  You may have some home medications which will be placed on hold until you complete the course of blood thinner medication.  It is important for you to complete the blood thinner medication as prescribed.  PRECAUTIONS:  If you experience chest pain or shortness of breath - call 911 immediately for transfer to the hospital emergency department.   If you develop a fever greater that 101 F, purulent drainage from wound, increased redness or drainage from wound, foul odor from the wound/dressing, or calf pain - CONTACT YOUR SURGEON.                                                   FOLLOW-UP APPOINTMENTS:  If you do not already have a post-op appointment, please call the office for an appointment to be seen by your surgeon.  Guidelines for how soon to be seen are listed in your "After Visit Summary", but are typically between 1-4 weeks after surgery.  OTHER INSTRUCTIONS:   Knee  Replacement:  Do not place pillow under knee, focus on keeping the knee straight while resting. CPM instructions: 0-90 degrees, 2 hours in the morning, 2 hours in the afternoon, and 2 hours in the evening. Place foam block, curve side up under heel at all times except when in CPM or when walking.  DO NOT modify, tear, cut, or change the foam block in any way.   DENTAL ANTIBIOTICS:  In most cases prophylactic antibiotics for Dental procdeures after total joint surgery are not necessary.  Exceptions are as follows:  1. History of prior total joint infection  2. Severely immunocompromised (Organ Transplant, cancer chemotherapy, Rheumatoid biologic meds such as [place])  3. Poorly controlled diabetes (A1C &gt; 8.0, blood glucose over 200)  If you have one of these conditions, contact your surgeon for an antibiotic prescription, prior to your dental procedure.   MAKE SURE YOU:  . Understand these instructions.  . Get help right away if you are not doing well or get worse.    Thank you for letting us be a part of your medical care team.  It is a privilege we respect greatly.  We hope these instructions will help you stay on track for a fast and full recovery!  INSTRUCTIONS AFTER JOINT REPLACEMENT   o Remove items at home which could result in a fall. This includes throw rugs or furniture in walking pathways o ICE to the affected joint every three hours while awake for 30 minutes at a time, for at least the first 3-5 days, and then as needed for pain and swelling.  Continue to use ice for pain and swelling. You may notice swelling that will progress down to the foot and ankle.  This is normal after surgery.  Elevate your leg when you are not up walking on it.   o Continue to use the breathing machine you got in the hospital (incentive spirometer) which will help keep your temperature down.  It is common for your temperature to cycle up and down following surgery, especially at  night when you  are not up moving around and exerting yourself.  The breathing machine keeps your lungs expanded and your temperature down.   DIET:  As you were doing prior to hospitalization, we recommend a well-balanced diet.  DRESSING / WOUND CARE / SHOWERING  Keep the surgical dressing until follow up.  The dressing is water proof, so you can shower without any extra covering.  IF THE DRESSING FALLS OFF or the wound gets wet inside, change the dressing with sterile gauze.  Please use good hand washing techniques before changing the dressing.  Do not use any lotions or creams on the incision until instructed by your surgeon.    ACTIVITY  o Increase activity slowly as tolerated, but follow the weight bearing instructions below.   o No driving for 6 weeks or until further direction given by your physician.  You cannot drive while taking narcotics.  o No lifting or carrying greater than 10 lbs. until further directed by your surgeon. o Avoid periods of inactivity such as sitting longer than an hour when not asleep. This helps prevent blood clots.  o You may return to work once you are authorized by your doctor.     WEIGHT BEARING   Weight bearing as tolerated with assist device (walker, cane, etc) as directed, use it as long as suggested by your surgeon or therapist, typically at least 4-6 weeks.   EXERCISES  Results after joint replacement surgery are often greatly improved when you follow the exercise, range of motion and muscle strengthening exercises prescribed by your doctor. Safety measures are also important to protect the joint from further injury. Any time any of these exercises cause you to have increased pain or swelling, decrease what you are doing until you are comfortable again and then slowly increase them. If you have problems or questions, call your caregiver or physical therapist for advice.   Rehabilitation is important following a joint replacement. After just a few days of  immobilization, the muscles of the leg can become weakened and shrink (atrophy).  These exercises are designed to build up the tone and strength of the thigh and leg muscles and to improve motion. Often times heat used for twenty to thirty minutes before working out will loosen up your tissues and help with improving the range of motion but do not use heat for the first two weeks following surgery (sometimes heat can increase post-operative swelling).   These exercises can be done on a training (exercise) mat, on the floor, on a table or on a bed. Use whatever works the best and is most comfortable for you.    Use music or television while you are exercising so that the exercises are a pleasant break in your day. This will make your life better with the exercises acting as a break in your routine that you can look forward to.   Perform all exercises about fifteen times, three times per day or as directed.  You should exercise both the operative leg and the other leg as well.  Exercises include:   . Quad Sets - Tighten up the muscle on the front of the thigh (Quad) and hold for 5-10 seconds.   . Straight Leg Raises - With your knee straight (if you were given a brace, keep it on), lift the leg to 60 degrees, hold for 3 seconds, and slowly lower the leg.  Perform this exercise against resistance later as your leg gets stronger.  . Leg Slides:  Lying on your back, slowly slide your foot toward your buttocks, bending your knee up off the floor (only go as far as is comfortable). Then slowly slide your foot back down until your leg is flat on the floor again.  [place] Haring Wings: Lying on your back spread your legs to the side as far apart as you can without causing discomfort.  . Hamstring Strength:  Lying on your back, push your heel against the floor with your leg straight by tightening up the muscles of your buttocks.  Repeat, but this time bend your knee to a comfortable angle, and push your heel against the  floor.  You may put a pillow under the heel to make it more comfortable if necessary.   A rehabilitation program following joint replacement surgery can speed recovery and prevent re-injury in the future due to weakened muscles. Contact your doctor or a physical therapist for more information on knee rehabilitation.    CONSTIPATION  Constipation is defined medically as fewer than three stools per week and severe constipation as less than one stool per week.  Even if you have a regular bowel pattern at home, your normal regimen is likely to be disrupted due to multiple reasons following surgery.  Combination of anesthesia, postoperative narcotics, change in appetite and fluid intake all can affect your bowels.   YOU MUST use at least one of the following options; they are listed in order of increasing strength to get the job done.  They are all available over the counter, and you may need to use some, POSSIBLY even all of these options:    Drink plenty of fluids (prune juice may be helpful) and high fiber foods Colace 100 mg by mouth twice a day  Senokot for constipation as directed and as needed Dulcolax (bisacodyl), take with full glass of water  Miralax (polyethylene glycol) once or twice a day as needed.  If you have tried all these things and are unable to have a bowel movement in the first 3-4 days after surgery call either your surgeon or your primary doctor.    If you experience loose stools or diarrhea, hold the medications until you stool forms back up.  If your symptoms do not get better within 1 week or if they get worse, check with your doctor.  If you experience "the worst abdominal pain ever" or develop nausea or vomiting, please contact the office immediately for further recommendations for treatment.   ITCHING:  If you experience itching with your medications, try taking only a single pain pill, or even half a pain pill at a time.  You can also use Benadryl over the counter for  itching or also to help with sleep.   TED HOSE STOCKINGS:  Use stockings on both legs until for at least 2 weeks or as directed by physician office. They may be removed at night for sleeping.  MEDICATIONS:  See your medication summary on the "After Visit Summary" that nursing will review with you.  You may have some home medications which will be placed on hold until you complete the course of blood thinner medication.  It is important for you to complete the blood thinner medication as prescribed.  PRECAUTIONS:  If you experience chest pain or shortness of breath - call 911 immediately for transfer to the hospital emergency department.   If you develop a fever greater that 101 F, purulent drainage from wound, increased redness or drainage from wound, foul odor from the  wound/dressing, or calf pain - CONTACT YOUR SURGEON.                                                   FOLLOW-UP APPOINTMENTS:  If you do not already have a post-op appointment, please call the office for an appointment to be seen by your surgeon.  Guidelines for how soon to be seen are listed in your "After Visit Summary", but are typically between 1-4 weeks after surgery.  OTHER INSTRUCTIONS:   Knee Replacement:  Do not place pillow under knee, focus on keeping the knee straight while resting. CPM instructions: 0-90 degrees, 2 hours in the morning, 2 hours in the afternoon, and 2 hours in the evening. Place foam block, curve side up under heel at all times except when in CPM or when walking.  DO NOT modify, tear, cut, or change the foam block in any way.   DENTAL ANTIBIOTICS:  In most cases prophylactic antibiotics for Dental procdeures after total joint surgery are not necessary.  Exceptions are as follows:  1. History of prior total joint infection  2. Severely immunocompromised (Organ Transplant, cancer chemotherapy, Rheumatoid biologic meds such as [place])  3. Poorly controlled diabetes (A1C &gt; 8.0, blood glucose  over 200)  If you have one of these conditions, contact your surgeon for an antibiotic prescription, prior to your dental procedure.   MAKE SURE YOU:  . Understand these instructions.  . Get help right away if you are not doing well or get worse.    Thank you for letting us be a part of your medical care team.  It is a privilege we respect greatly.  We hope these instructions will help you stay on track for a fast and full recovery!   Dental Antibiotics:  In most cases prophylactic antibiotics for Dental procdeures after total joint surgery are not necessary.  Exceptions are as follows:  1. History of prior total joint infection  2. Severely immunocompromised (Organ Transplant, cancer chemotherapy, Rheumatoid biologic meds such as [place])  3. Poorly controlled diabetes (A1C &gt; 8.0, blood glucose over 200)  If you have one of these conditions, contact your surgeon for an antibiotic prescription, prior to your dental procedure.

## 2020-04-04 NOTE — Progress Notes (Signed)
Subjective: 1 Day Post-Op Procedure(s) (LRB): LEFT TOTAL HIP ARTHROPLASTY ANTERIOR APPROACH (Left) Patient reports pain as moderate.    Objective: Vital signs in last 24 hours: Temp:  [97.2 F (36.2 C)-98.9 F (37.2 C)] 98.7 F (37.1 C) (03/11 0610) Pulse Rate:  [42-76] 67 (03/11 0610) Resp:  [13-17] 16 (03/11 0610) BP: (101-162)/(50-103) 111/67 (03/11 0610) SpO2:  [94 %-100 %] 96 % (03/11 0610) Weight:  [94.8 kg] 94.8 kg (03/10 1301)  Intake/Output from previous day: 03/10 0701 - 03/11 0700 In: 3793.2 [P.O.:1440; I.V.:2353.2] Out: 2650 [Urine:2550; Blood:100] Intake/Output this shift: No intake/output data recorded.  Recent Labs    04/04/20 0319  HGB 12.1*   Recent Labs    04/04/20 0319  WBC 11.9*  RBC 4.02*  HCT 36.3*  PLT 236   Recent Labs    04/04/20 0319  NA 135  K 4.0  CL 103  CO2 23  BUN 20  CREATININE 1.08  GLUCOSE 124*  CALCIUM 8.9   No results for input(s): LABPT, INR in the last 72 hours.  Sensation intact distally Intact pulses distally Dorsiflexion/Plantar flexion intact Incision: dressing C/D/I   Assessment/Plan: 1 Day Post-Op Procedure(s) (LRB): LEFT TOTAL HIP ARTHROPLASTY ANTERIOR APPROACH (Left) Up with therapy Discharge home with home health    Patient's anticipated LOS is less than 2 midnights, meeting these requirements: - Younger than 58 - Lives within 1 hour of care - Has a competent adult at home to recover with post-op recover - NO history of  - Chronic pain requiring opiods  - Diabetes  - Coronary Artery Disease  - Heart failure  - Heart attack  - Stroke  - DVT/VTE  - Cardiac arrhythmia  - Respiratory Failure/COPD  - Renal failure  - Anemia  - Advanced Liver disease       Mcarthur Rossetti 04/04/2020, 7:15 AM

## 2020-04-04 NOTE — Op Note (Signed)
NAMEODYSSEUS, CADA MEDICAL RECORD NO: [id number] ACCOUNT NO: [id number] DATE OF BIRTH: [birth year] FACILITY: [facility]: [facility] PHYSICIAN: Lind Guest. Ninfa Linden, MD  Operative Report   DATE OF PROCEDURE: 04/03/2020   PREOPERATIVE DIAGNOSIS:  Primary osteoarthritis and degenerative joint disease, left hip.  POSTOPERATIVE DIAGNOSIS:  Primary osteoarthritis and degenerative joint disease, left hip.  PROCEDURE:  Left total hip arthroplasty through direct anterior approach.  IMPLANTS:  DePuy sector Gription acetabular component, size 56, size 36+0 neutral polyethylene liner, size 13 Corail femoral component with high offset, size 36+1.5 ceramic hip ball.  SURGEON:  Jean Rosenthal, M.D.  ASSISTANT:  Erskine Emery, PA-C.  ANESTHESIA:  Spinal.  ANTIBIOTICS:  2 g IV Ancef.  ESTIMATED BLOOD LOSS:  782 mL  COMPLICATIONS:  None.  INDICATIONS:  The patient is a 50 year old gentleman well known to me.  He has been dealing with debilitating arthritis involving his left hip for over 5 years now.  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  He has had  multiple steroid injections in the hip joint over years.  At this point, his left hip pain is daily.  His hip is stiff.  It is detrimentally affecting his mobility, his quality of life, and his activities of daily living.  At this point, we both agreed  it is time to proceed with hip replacement surgery.  We had a long and thorough discussion about the risk of acute blood loss anemia, nerve or vessel injury, fracture, infection, DVT, dislocation, implant failure and skin and soft tissue issues.  We  talked about our goals being decreased pain, improve mobility and overall improve quality of life.  DESCRIPTION OF PROCEDURE:  After informed consent was obtained, appropriate left hip was marked, he was brought to the operating room and sat up on a stretcher where spinal anesthesia was obtained, he was laid in supine  position on a stretcher.  Foley  catheter was placed.  I was able to get a really good leg length assessment on him and his leg lengths are equal.  He does have arthritic changes in his right hip, but is thus far asymptomatic.  Traction boots were placed on both his feet.  Next, he was  placed supine on the Hana fracture table, the perineal post in place and both legs in line skeletal traction device and no traction applied.  His left operative hip was prepped and draped with DuraPrep and sterile drapes.  A timeout was called and he was  identified correct patient, correct left hip.  I then made an incision just inferior and posterior to the anterior superior iliac spine and carried this slightly obliquely down the leg.  I dissected down tensor fascia lata muscle.  Tensor fascia was  then divided longitudinally to proceed with direct anterior approach to the hip.  I identified and cauterized circumflex vessels.  We identified the hip capsule, opened up the hip capsule in L-type format and did not find any significant effusion.  I  placed Cobra retractors around the medial and lateral femoral neck within the joint capsule itself and made a femoral neck cut with an oscillating saw just proximal to the lesser trochanter.  We completed this with an osteotome.  We placed a corkscrew  guide in the femoral head and removed the femoral head in its entirety and found a wide area of the weightbearing surface that was devoid of cartilage and hard as a rock.  I then placed a bent Hohmann over the  medial acetabular rim and removed remnants  of the acetabular labrum and other debris as well as periarticular osteophytes around the acetabulum.  I then began reaming in a stepwise increments from a size 44 reamer going up to a size 55 with all reamers placed under direct visualization.  Last  reamer was placed under direct fluoroscopy, so we obtained our depth of reaming, our inclination and anteversion.  I then placed a real  DePuy sector Gription acetabular component, size 56 and a 36+0 neutral polyethylene liner.  Attention was then turned  to the femur with the leg externally rotated 120 degrees extended and adducted. We then placed a Mueller retractor medially and Hohman retractor behind the greater trochanter, we released lateral joint capsule and used a box cutting osteotome to enter  the femoral canal and a rongeur to lateralize, then began broaching using the carotid broaching system from a size 8 going up to size 13.  With the size 13 in place we tried high offset femoral neck and a 36+15 trial hip ball.  We brought the leg back  over and up and with traction, internal rotation reducing the pelvis.  We were pleased with leg length, offset, range of motion and stability assessed radiographically and mechanically.  I then dislocated the hip, removed the trial components.  We placed  the real Corail femoral component with high offset size 13 and the real 36+1.5 ceramic hip ball and again reduced this and acetabulum and we were pleased with stability and range of motion, leg length and offset measured radiographically and clinically.   We then irrigated the soft tissue with normal saline solution using pulsatile lavage.  We closed the joint capsule with interrupted #1 Ethibond suture followed by running #1 Vicryl to close the tensor fascia.  0 Vicryl was used to close the deep tissue  and 2-0 Vicryl was used to close subcutaneous tissue.  The subcuticular layer was closed with Monocryl suture and Steri-Strips were applied.  An Aquacel dressing was placed.  The patient was taken off Hana table and taken to recovery room in stable  condition with all final counts were correct.  There were no complications.  Of note, Benita Stabile, PA-C assisted during the entire case and assistance was crucial for facilitating all aspects of this case.   Elián.Darby D: 04/03/2020 11:22:44 am T: 04/04/2020 4:02:00 am  JOB: [id number]/ [id number]

## 2020-04-04 NOTE — Progress Notes (Signed)
Physical Therapy Treatment Patient Details Name: Randy Bell MRN: [id number] DOB: Oct 03, 1970 Today's Date: 04/04/2020    History of Present Illness Patient is 50 y.o. male s/p Lt THA anterior approach on 04/03/20 with PMH significant for HTN, OA.    PT Comments    Pt in good spirits and progressing well with mobility.  Pt up to ambulate increased distance in hall, negotiated stairs, reviewed car transfers and performed HEP program with assist.  Pt eager for dc home.   Follow Up Recommendations  Home health PT;Follow surgeon's recommendation for DC plan and follow-up therapies     Equipment Recommendations  None recommended by PT    Recommendations for Other Services       Precautions / Restrictions Precautions Precautions: Fall Restrictions Weight Bearing Restrictions: No LLE Weight Bearing: Weight bearing as tolerated    Mobility  Bed Mobility Overal bed mobility: Needs Assistance Bed Mobility: Supine to Sit     Supine to sit: Supervision     General bed mobility comments: cues for sequence and use of R LE to self assist    Transfers Overall transfer level: Needs assistance Equipment used: Rolling walker (2 wheeled) Transfers: Sit to/from Stand Sit to Stand: Min guard;Supervision         General transfer comment: cues for LE management and use of UEs to self assist  Ambulation/Gait Ambulation/Gait assistance: Min guard;Supervision Gait Distance (Feet): 200 Feet Assistive device: Rolling walker (2 wheeled) Gait Pattern/deviations: Step-to pattern;Step-through pattern;Decreased step length - right;Decreased step length - left;Shuffle;Trunk flexed     General Gait Details: cues for initial sequence, posture, position from RW and ER on L   Stairs Stairs: Yes Stairs assistance: Min assist Stair Management: No rails;Step to pattern;Backwards;Forwards;With walker Number of Stairs: 3 General stair comments: 2 steps fwd with RW at top and using doorframe to  assist.  Single step bkwd for use of stool into high bed   Wheelchair Mobility    Modified Rankin (Stroke Patients Only)       Balance Overall balance assessment: Needs assistance Sitting-balance support: Feet supported Sitting balance-Leahy Scale: Good     Standing balance support: No upper extremity supported Standing balance-Leahy Scale: Fair                              Cognition Arousal/Alertness: Awake/alert Behavior During Therapy: WFL for tasks assessed/performed Overall Cognitive Status: Within Functional Limits for tasks assessed                                        Exercises Total Joint Exercises Ankle Circles/Pumps: AROM;Both;20 reps;Seated Quad Sets: AROM;Both;10 reps;Supine Heel Slides: Left;AAROM;20 reps;Supine Hip ABduction/ADduction: AAROM;Left;15 reps;Supine    General Comments        Pertinent Vitals/Pain Pain Assessment: 0-10 Pain Score: 4  Pain Location: Lt hip Pain Descriptors / Indicators: Sore Pain Intervention(s): Limited activity within patient's tolerance;Monitored during session;Premedicated before session;Ice applied    Home Living                      Prior Function            PT Goals (current goals can now be found in the care plan section) Acute Rehab PT Goals Patient Stated Goal: get back to participating in  water sports PT Goal Formulation: With patient Time For Goal  Achievement: 04/10/20 Potential to Achieve Goals: Good Progress towards PT goals: Progressing toward goals    Frequency    7X/week      PT Plan Current plan remains appropriate    Co-evaluation              AM-PAC PT "6 Clicks" Mobility   Outcome Measure  Help needed turning from your back to your side while in a flat bed without using bedrails?: None Help needed moving from lying on your back to sitting on the side of a flat bed without using bedrails?: None Help needed moving to and from a bed to  a chair (including a wheelchair)?: A Little Help needed standing up from a chair using your arms (e.g., wheelchair or bedside chair)?: A Little Help needed to walk in hospital room?: A Little Help needed climbing 3-5 steps with a railing? : A Little 6 Click Score: 20    End of Session Equipment Utilized During Treatment: Gait belt Activity Tolerance: Patient tolerated treatment well Patient left: in chair;with call bell/phone within reach;with chair alarm set Nurse Communication: Mobility status PT Visit Diagnosis: Pain;Difficulty in walking, not elsewhere classified (R26.2) Pain - Right/Left: Left Pain - part of body: Hip     Time: 0912-0955 PT Time Calculation (min) (ACUTE ONLY): 43 min  Charges:  $Gait Training: 8-22 mins $Therapeutic Exercise: 8-22 mins $Therapeutic Activity: 8-22 mins                     [place] Pager [phone] Office [phone]    Fatoumata Albaugh 04/04/2020, 12:16 PM

## 2020-04-04 NOTE — Discharge Summary (Signed)
Patient ID: Randy Bell MRN: [id number] DOB/AGE: 02-Feb-1970 50 y.o.  Admit date: 04/03/2020 Discharge date: 04/04/2020  Admission Diagnoses:  Principal Problem:   Primary osteoarthritis of left hip Active Problems:   Status post total replacement of left hip   Discharge Diagnoses:  Same  Past Medical History:  Diagnosis Date   Arthritis    Basal cell carcinoma    Neck   Dizziness    Hypertension     Surgeries: Procedure(s): LEFT TOTAL HIP ARTHROPLASTY ANTERIOR APPROACH on 04/03/2020   Consultants:   Discharged Condition: Improved  Hospital Course: Randy Bell is an 50 y.o. male who was admitted 04/03/2020 for operative treatment ofPrimary osteoarthritis of left hip. Patient has severe unremitting pain that affects sleep, daily activities, and work/hobbies. After pre-op clearance the patient was taken to the operating room on 04/03/2020 and underwent  Procedure(s): LEFT TOTAL HIP ARTHROPLASTY ANTERIOR APPROACH.    Patient was given perioperative antibiotics:  Anti-infectives (From admission, onward)   Start     Dose/Rate Route Frequency Ordered Stop   04/03/20 1600  ceFAZolin (ANCEF) IVPB 1 g/50 mL premix        1 g 100 mL/hr over 30 Minutes Intravenous Every 6 hours 04/03/20 1257 04/03/20 2137   04/03/20 0800  ceFAZolin (ANCEF) IVPB 2g/100 mL premix        2 g 200 mL/hr over 30 Minutes Intravenous On call to O.R. 04/03/20 0751 04/03/20 1009       Patient was given sequential compression devices, early ambulation, and chemoprophylaxis to prevent DVT.  Patient benefited maximally from hospital stay and there were no complications.    Recent vital signs:  Patient Vitals for the past 24 hrs:  BP Temp Temp src Pulse Resp SpO2 Height Weight  04/04/20 0610 111/67 98.7 F (37.1 C) Oral 67 16 96 % -- --  04/04/20 0144 115/65 98.7 F (37.1 C) Oral 65 15 94 % -- --  04/03/20 2103 (!) 141/85 98.9 F (37.2 C) Oral 76 15 95 % -- --  04/03/20 1646 113/73 (!) 97.5 F  (36.4 C) Oral (!) 48 14 100 % -- --  04/03/20 1500 136/80 (!) 97.5 F (36.4 C) Oral (!) 54 16 98 % -- --  04/03/20 1430 (!) 102/50 (!) 97.2 F (36.2 C) Oral (!) 43 14 98 % -- --  04/03/20 1301 101/63 (!) 97.5 F (36.4 C) -- (!) 43 17 97 % 6' (1.829 m) 94.8 kg  04/03/20 1245 (!) 101/57 (!) 97.5 F (36.4 C) -- (!) 42 13 100 % -- --  04/03/20 1230 106/74 -- -- (!) 42 13 100 % -- --  04/03/20 1215 112/75 -- -- (!) 43 14 100 % -- --  04/03/20 1200 (!) 109/56 -- -- (!) 55 15 100 % -- --  04/03/20 1145 (!) 102/58 97.6 F (36.4 C) -- 65 14 98 % -- --  04/03/20 0808 (!) 162/103 98.3 F (36.8 C) Oral 63 16 100 % -- --     Recent laboratory studies:  Recent Labs    04/04/20 0319  WBC 11.9*  HGB 12.1*  HCT 36.3*  PLT 236  NA 135  K 4.0  CL 103  CO2 23  BUN 20  CREATININE 1.08  GLUCOSE 124*  CALCIUM 8.9     Discharge Medications:   Allergies as of 04/04/2020   No Known Allergies     Medication List    TAKE these medications   acetaminophen 500 MG tablet Commonly known as: TYLENOL  Take 500 mg by mouth every 8 (eight) hours as needed for moderate pain.   aspirin 81 MG chewable tablet Chew 1 tablet (81 mg total) by mouth 2 (two) times daily.   CINNAMON PO Take 1 tablet by mouth 2 (two) times daily.   FISH OIL PO Take 1 capsule by mouth 2 (two) times daily.   GLUCOSAMINE PO Take 1 tablet by mouth 2 (two) times daily.   hydrochlorothiazide 12.5 MG capsule Commonly known as: MICROZIDE Take 1 capsule by mouth once daily   ibuprofen 200 MG tablet Commonly known as: ADVIL Take 4 tablets (800 mg total) by mouth every 8 (eight) hours as needed for moderate pain or headache. What changed: how much to take   MELATONIN PO Take 1 capsule by mouth at bedtime as needed (sleep).   methocarbamol 500 MG tablet Commonly known as: ROBAXIN Take 1 tablet (500 mg total) by mouth every 6 (six) hours as needed for muscle spasms.   multivitamin with minerals Tabs tablet Take 1  tablet by mouth daily.   oxyCODONE 5 MG immediate release tablet Commonly known as: Oxy IR/ROXICODONE Take 1-2 tablets (5-10 mg total) by mouth every 6 (six) hours as needed for moderate pain (pain score 4-6).   rosuvastatin 5 MG tablet Commonly known as: CRESTOR Take 1 tablet (5 mg total) by mouth 3 (three) times a week.   VITAMIN C PO Take 1 tablet by mouth daily.   VITAMIN D PO Take 1 tablet by mouth daily.            Durable Medical Equipment  (From admission, onward)         Start     Ordered   04/03/20 1258  DME 3 n 1  Once        04/03/20 1257   04/03/20 1258  DME Walker rolling  Once       Question Answer Comment  Walker: With 5 Inch Wheels   Patient needs a walker to treat with the following condition Status post total replacement of left hip      04/03/20 1257          Diagnostic Studies: DG Pelvis Portable  Result Date: 04/03/2020 CLINICAL DATA:  Postop LEFT-sided total hip arthroplasty. EXAM: PORTABLE PELVIS 1-2 VIEWS COMPARISON:  Intraoperative radiographs of the same date. FINDINGS: AP view of the pelvis with changes of LEFT total hip arthroplasty. Small bone fragment along the inferior margin of the LEFT acetabulum in the setting of recent hip replacement may be related to postoperative change. AP view with postoperative gas about the LEFT upper thigh and hip. Femoral and acetabular components appear unremarkable. No visible fracture of the bony pelvis. Images exclude the upper aspects of the iliac crest. IMPRESSION: 1. Postoperative changes of LEFT total hip arthroplasty. 2. Small bone fragment along the inferior margin of the LEFT acetabulum in the setting of recent hip replacement may be related to postoperative change. No visible fracture along the iliopectineal line. Could consider additional views of the pelvis for further assessment as warranted. 3. Upper aspect of the iliac crests excluded from view. These results will be called to the ordering  clinician or representative by the Radiologist Assistant, and communication documented in the PACS or [employer]. Electronically Signed   By: Zetta Bills M.D.   On: 04/03/2020 14:18   DG C-Arm 1-60 Min-No Report  Result Date: 04/03/2020 Fluoroscopy was utilized by the requesting physician.  No radiographic interpretation.   DG HIP OPERATIVE  UNILAT WITH PELVIS LEFT  Result Date: 04/03/2020 CLINICAL DATA:  Intraoperative LEFT hip total hip replacement. EXAM: OPERATIVE LEFT HIP (WITH PELVIS IF PERFORMED) 3 VIEWS TECHNIQUE: Fluoroscopic spot image(s) were submitted for interpretation post-operatively. COMPARISON:  February 13, 2020 FINDINGS: Post LEFT total hip arthroplasty. Limited intraoperative views without signs of complication. Low AP pelvis and two views of the LEFT hip provided. FLUOROSCOPY TIME:  16 seconds Dose: 2.2245 mGy IMPRESSION: Intraoperative views during LEFT total hip arthroplasty. No immediate complications Electronically Signed   By: Zetta Bills M.D.   On: 04/03/2020 14:14    Disposition: Discharge disposition: 01-Home or [place]    Mcarthur Rossetti, MD Follow up in 2 week(s).   Specialty: Orthopedic Surgery Contact information: [address] [place] [address] [phone]                Signed: Mcarthur Rossetti 04/04/2020, 7:18 AM

## 2020-04-07 ENCOUNTER — Encounter (HOSPITAL_COMMUNITY): Payer: Self-pay | Admitting: Orthopaedic Surgery

## 2020-04-08 ENCOUNTER — Telehealth: Payer: Self-pay | Admitting: Orthopaedic Surgery

## 2020-04-08 NOTE — Telephone Encounter (Signed)
Called and answered questions

## 2020-04-08 NOTE — Telephone Encounter (Signed)
Patient called. He has some questions about how he is taking his pain medication. He would like a call back number [phone]

## 2020-04-09 ENCOUNTER — Telehealth: Payer: Self-pay | Admitting: Orthopaedic Surgery

## 2020-04-09 NOTE — Telephone Encounter (Signed)
Yes

## 2020-04-09 NOTE — Telephone Encounter (Signed)
Patient aware of the below message  

## 2020-04-09 NOTE — Telephone Encounter (Signed)
Since pt is off of oxycodone is he released to be able to drive? Please Call back [phone]

## 2020-04-09 NOTE — Telephone Encounter (Signed)
Please advise  Left THA 04/03/20

## 2020-04-10 NOTE — Telephone Encounter (Signed)
Opened in error

## 2020-04-15 ENCOUNTER — Encounter: Payer: Self-pay | Admitting: Orthopaedic Surgery

## 2020-04-15 ENCOUNTER — Ambulatory Visit (INDEPENDENT_AMBULATORY_CARE_PROVIDER_SITE_OTHER): Payer: 59 | Admitting: Orthopaedic Surgery

## 2020-04-15 DIAGNOSIS — Z96642 Presence of left artificial hip joint: Secondary | ICD-10-CM

## 2020-04-15 MED ORDER — OXYCODONE HCL 5 MG PO TABS: 5.0000 mg | ORAL_TABLET | Freq: Four times a day (QID) | ORAL | 0 refills | Status: DC | PRN

## 2020-04-15 NOTE — Progress Notes (Signed)
HPI: Randy Bell returns today status post left total hip arthroplasty 12 days postop.  He is overall doing well.  He reports he is walking over a mile a day and using his peddler at home.  He states he has developed a fluid accumulation in his left hip over the past few days.  No injury.  Denies any fevers chills.  He is on 81 mg aspirin twice daily.  He was on no aspirin prior to surgery.  Physical exam: Left hip good range of motion.  Ambulates with a very slight antalgic gait without any assistive device.  Surgical incisions healing well no signs of dehiscence.  Wounds closed with a subcu stitch.  Left calf supple nontender.  Positive seroma.  Seroma is aspirated today 60 cc of serosanguineous fluids pain.  Patient tolerates well.  Impression: Status post left total hip arthroplasty 04/03/2020  Plan: He is able to get the incision wet in the shower.  Work on TEFL teacher.  He will go on 81 mg aspirin once daily for another week and then discontinue.  Slowly progress activities as tolerated.  Questions were encouraged and answered at length.  We will see him back in 1 week.  Sooner if there is any questions or concerns.

## 2020-04-17 ENCOUNTER — Inpatient Hospital Stay: Payer: 59 | Admitting: Orthopaedic Surgery

## 2020-04-23 ENCOUNTER — Encounter: Payer: Self-pay | Admitting: Orthopaedic Surgery

## 2020-04-23 ENCOUNTER — Ambulatory Visit (INDEPENDENT_AMBULATORY_CARE_PROVIDER_SITE_OTHER): Payer: 59 | Admitting: Orthopaedic Surgery

## 2020-04-23 DIAGNOSIS — Z96642 Presence of left artificial hip joint: Secondary | ICD-10-CM

## 2020-04-23 NOTE — Progress Notes (Signed)
To be 3 weeks tomorrow status post a left total hip arthroplasty.  He is doing well.  He did have his postoperative visit last week where 60 cc of a seroma fluid was drained off of his left hip.  He says some of that is reaccumulated but overall he is doing well.  He has been working from home.  On exam there is some fullness of his left hip.  I was only able to aspirate about 20 to 25 cc of fluid from this area consistent with a seroma.  His incision looks great.  He is walking without assistive device and doing great overall.  We will have him continue work for home for the next 4 weeks and less he feels better and can return.  I will reevaluate in 4 weeks with repeat exam but no x-rays are needed.  All questions and concerns were answered and addressed.

## 2020-04-28 ENCOUNTER — Other Ambulatory Visit: Payer: 59

## 2020-04-29 ENCOUNTER — Other Ambulatory Visit: Payer: Self-pay

## 2020-04-29 ENCOUNTER — Other Ambulatory Visit: Payer: 59

## 2020-04-29 DIAGNOSIS — Z0000 Encounter for general adult medical examination without abnormal findings: Secondary | ICD-10-CM

## 2020-04-29 DIAGNOSIS — E785 Hyperlipidemia, unspecified: Secondary | ICD-10-CM

## 2020-04-30 LAB — COMPREHENSIVE METABOLIC PANEL
ALT: 22 IU/L (ref 0–44)
AST: 23 IU/L (ref 0–40)
Albumin/Globulin Ratio: 1.8 (ref 1.2–2.2)
Albumin: 4.4 g/dL (ref 3.8–4.9)
Alkaline Phosphatase: 162 IU/L — ABNORMAL HIGH (ref 44–121)
BUN/Creatinine Ratio: 15 (ref 9–20)
BUN: 15 mg/dL (ref 6–24)
Bilirubin Total: 0.5 mg/dL (ref 0.0–1.2)
CO2: 23 mmol/L (ref 20–29)
Calcium: 10.1 mg/dL (ref 8.7–10.2)
Chloride: 104 mmol/L (ref 96–106)
Creatinine, Ser: 1.01 mg/dL (ref 0.76–1.27)
Globulin, Total: 2.5 g/dL (ref 1.5–4.5)
Glucose: 86 mg/dL (ref 65–99)
Potassium: 4.6 mmol/L (ref 3.5–5.2)
Sodium: 144 mmol/L (ref 134–144)
Total Protein: 6.9 g/dL (ref 6.0–8.5)
eGFR: 90 mL/min/{1.73_m2} (ref >59)

## 2020-04-30 LAB — LIPID PANEL
Chol/HDL Ratio: 5.9 ratio — ABNORMAL HIGH (ref 0.0–5.0)
Cholesterol, Total: 226 mg/dL — ABNORMAL HIGH (ref 100–199)
HDL: 38 mg/dL — ABNORMAL LOW (ref >39)
LDL Chol Calc (NIH): 165 mg/dL — ABNORMAL HIGH (ref 0–99)
Triglycerides: 125 mg/dL (ref 0–149)
VLDL Cholesterol Cal: 23 mg/dL (ref 5–40)

## 2020-05-01 ENCOUNTER — Ambulatory Visit (INDEPENDENT_AMBULATORY_CARE_PROVIDER_SITE_OTHER): Payer: 59 | Admitting: Physician Assistant

## 2020-05-01 ENCOUNTER — Other Ambulatory Visit: Payer: Self-pay

## 2020-05-01 ENCOUNTER — Encounter: Payer: Self-pay | Admitting: Physician Assistant

## 2020-05-01 VITALS — BP 114/74 | HR 62 | Temp 98.7°F | Ht 72.0 in | Wt 212.8 lb

## 2020-05-01 DIAGNOSIS — E785 Hyperlipidemia, unspecified: Secondary | ICD-10-CM | POA: Diagnosis not present

## 2020-05-01 DIAGNOSIS — Z1211 Encounter for screening for malignant neoplasm of colon: Secondary | ICD-10-CM

## 2020-05-01 DIAGNOSIS — R030 Elevated blood-pressure reading, without diagnosis of hypertension: Secondary | ICD-10-CM | POA: Diagnosis not present

## 2020-05-01 MED ORDER — HYDROCHLOROTHIAZIDE 12.5 MG PO CAPS: 12.5000 mg | ORAL_CAPSULE | Freq: Every day | ORAL | 1 refills | Status: DC

## 2020-05-01 MED ORDER — ROSUVASTATIN CALCIUM 5 MG PO TABS: 5.0000 mg | ORAL_TABLET | Freq: Every day | ORAL | 1 refills | Status: DC

## 2020-05-01 NOTE — Progress Notes (Signed)
Established Patient Office Visit  Subjective:  Patient ID: Randy Bell, male    DOB: September 10, 1970  Age: 50 y.o. MRN: [id number]  CC:  Chief Complaint  Patient presents with  . Follow-up  . Hyperlipidemia  . Hypertension    HPI Randy Bell presents for follow up on hyperlipidemia. Patient taking medication three times weekly and reports sometimes forgets to take a dose and unsure if can take the next day. Patient is s/p total left hip replacement and is recovering well. However, has not been as active as before (cyclist- rides bike) but plans to resume once cleared by Orthopedics. Denies diet changes.   Elevated blood pressure: Patient was started on hydrochlorothiazide by cardiology 06/26/2019 due to elevated blood pressure readings and symptoms of palpitations and dizziness/lightheadedness which patient reports continues to have but have mildly improved. Monitors BP at home sometimes and reports ambulatory readings are usually elevated but have recently been more like today in the 110s and unsure if BP device or pain was contributing to elevated BP. Reports good hydration.    Past Medical History:  Diagnosis Date  . Arthritis   . Basal cell carcinoma    Neck  . Dizziness   . Hypertension     Past Surgical History:  Procedure Laterality Date  . BASAL CELL CARCINOMA EXCISION     Neck  . FOOT SURGERY    . TOTAL HIP ARTHROPLASTY Left 04/03/2020   Procedure: LEFT TOTAL HIP ARTHROPLASTY ANTERIOR APPROACH;  Surgeon: Mcarthur Rossetti, MD;  Location: [facility];  Service: Orthopedics;  Laterality: Left;    Family History  Problem Relation Age of Onset  . Hyperlipidemia Mother   . Hyperlipidemia Father   . Stroke Father     Social History   Socioeconomic History  . Marital status: Married    Spouse name: Not on file  . Number of children: Not on file  . Years of education: Not on file  . Highest education level: Not on file  Occupational History  . Not on file   Tobacco Use  . Smoking status: Former Smoker    Years: 3.00    Types: Cigarettes  . Smokeless tobacco: Never Used  . Tobacco comment: quit 30 years   Vaping Use  . Vaping Use: Never used  Substance and Sexual Activity  . Alcohol use: Yes    Alcohol/week: 6.0 standard drinks    Types: 6 Standard drinks or equivalent per week  . Drug use: Never  . Sexual activity: Not on file  Other Topics Concern  . Not on file  Social History Narrative  . Not on file   Social Determinants of Health   Financial Resource Strain: Not on file  Food Insecurity: Not on file  Transportation Needs: Not on file  Physical Activity: Not on file  Stress: Not on file  Social Connections: Not on file  Intimate Partner Violence: Not on file    Outpatient Medications Prior to Visit  Medication Sig Dispense Refill  . acetaminophen (TYLENOL) 500 MG tablet Take 500 mg by mouth every 8 (eight) hours as needed for moderate pain.    . Ascorbic Acid (VITAMIN C PO) Take 1 tablet by mouth daily.    Marland Kitchen aspirin 81 MG chewable tablet Chew 1 tablet (81 mg total) by mouth 2 (two) times daily. 30 tablet 0  . CINNAMON PO Take 1 tablet by mouth 2 (two) times daily.    . Glucosamine HCl (GLUCOSAMINE PO) Take 1 tablet by mouth  2 (two) times daily.    Marland Kitchen ibuprofen (ADVIL) 200 MG tablet Take 4 tablets (800 mg total) by mouth every 8 (eight) hours as needed for moderate pain or headache. 30 tablet 0  . MELATONIN PO Take 1 capsule by mouth at bedtime as needed (sleep).    . methocarbamol (ROBAXIN) 500 MG tablet Take 1 tablet (500 mg total) by mouth every 6 (six) hours as needed for muscle spasms. 40 tablet 1  . Multiple Vitamin (MULTIVITAMIN WITH MINERALS) TABS tablet Take 1 tablet by mouth daily.    . Omega-3 Fatty Acids (FISH OIL PO) Take 1 capsule by mouth 2 (two) times daily.    Marland Kitchen oxyCODONE (OXY IR/ROXICODONE) 5 MG immediate release tablet Take 1-2 tablets (5-10 mg total) by mouth every 6 (six) hours as needed for moderate  pain (pain score 4-6). 30 tablet 0  . VITAMIN D PO Take 1 tablet by mouth daily.    . hydrochlorothiazide (MICROZIDE) 12.5 MG capsule Take 1 capsule by mouth once daily (Patient taking differently: Take 12.5 mg by mouth daily.) 90 capsule 0  . rosuvastatin (CRESTOR) 5 MG tablet Take 1 tablet (5 mg total) by mouth 3 (three) times a week. 36 tablet 0   No facility-administered medications prior to visit.    No Known Allergies  ROS Review of Systems A fourteen system review of systems was performed and found to be positive as per HPI.   Objective:    Physical Exam General:  Well Developed, well nourished, in no acute distress  Neuro:  Alert and oriented,  extra-ocular muscles intact  HEENT:  Normocephalic, atraumatic, neck supple Skin:  no gross rash, warm, pink. Cardiac:  RRR, S1 S2 wnl's, no murmur  Respiratory:  ECTA B/L w/o wheezing/crackles/rales, Not using accessory muscles, speaking in full sentences- unlabored. Vascular:  Ext warm, no cyanosis apprec.; cap RF less 2 sec. Psych:  No HI/SI, judgement and insight good, Euthymic mood. Full Affect.  BP 114/74   Pulse 62   Temp 98.7 F (37.1 C)   Ht 6' (1.829 m)   Wt 212 lb 12.8 oz (96.5 kg)   SpO2 100%   BMI 28.86 kg/m  Wt Readings from Last 3 Encounters:  05/01/20 212 lb 12.8 oz (96.5 kg)  04/03/20 209 lb (94.8 kg)  03/27/20 206 lb 6.4 oz (93.6 kg)     Health Maintenance Due  Topic Date Due  . COLONOSCOPY (Pts 45-63yrs Insurance coverage will need to be confirmed)  Never done  . COVID-19 Vaccine (3 - Booster for Pfizer series) 10/31/2019    There are no preventive care reminders to display for this patient.  No results found for: TSH Lab Results  Component Value Date   WBC 11.9 (H) 04/04/2020   HGB 12.1 (L) 04/04/2020   HCT 36.3 (L) 04/04/2020   MCV 90.3 04/04/2020   PLT 236 04/04/2020   Lab Results  Component Value Date   NA 144 04/29/2020   K 4.6 04/29/2020   CO2 23 04/29/2020   GLUCOSE 86 04/29/2020    BUN 15 04/29/2020   CREATININE 1.01 04/29/2020   BILITOT 0.5 04/29/2020   ALKPHOS 162 (H) 04/29/2020   AST 23 04/29/2020   ALT 22 04/29/2020   PROT 6.9 04/29/2020   ALBUMIN 4.4 04/29/2020   CALCIUM 10.1 04/29/2020   ANIONGAP 9 04/04/2020   Lab Results  Component Value Date   CHOL 226 (H) 04/29/2020   Lab Results  Component Value Date   HDL 38 (L)  04/29/2020   Lab Results  Component Value Date   LDLCALC 165 (H) 04/29/2020   Lab Results  Component Value Date   TRIG 125 04/29/2020   Lab Results  Component Value Date   CHOLHDL 5.9 (H) 04/29/2020   Lab Results  Component Value Date   HGBA1C 5.4 09/19/2018      Assessment & Plan:   Problem List Items Addressed This Visit      Other   Elevated blood pressure reading without diagnosis of hypertension   Relevant Medications   hydrochlorothiazide (MICROZIDE) 12.5 MG capsule   Hyperlipidemia - Primary   Relevant Medications   hydrochlorothiazide (MICROZIDE) 12.5 MG capsule   rosuvastatin (CRESTOR) 5 MG tablet    Other Visit Diagnoses    Screening for colon cancer       Relevant Orders   Ambulatory referral to Gastroenterology     Hyperlipidemia: -Discussed with patient most recent lipid panel- total cholesterol 226, triglycerides 125, HDL 38, LDL 165 (increased from 158).  Hepatic function normal.  Alkaline phosphatase elevated and will repeat in 12 weeks. Patient is agreeable to start taking Crestor 5 mg daily and will send a new prescription.  Advised to let me know if unable to tolerate medication. Recommend to resume physical activity as advised by orthopedics.  Continue heart healthy diet. -Will repeat lipid panel and hepatic function in 12 weeks.  Elevated blood pressure reading without diagnosis of hypertension: -BP at goal today. -Recent CMP renal function and electrolytes normal.  Recommend to continue hydrochlorothiazide. -Continue ambulatory BP monitoring and advised can bring BP device at next  follow-up visit. -Recommend to stay well-hydrated. -Will continue to monitor alongside cardiology.   Meds ordered this encounter  Medications  . hydrochlorothiazide (MICROZIDE) 12.5 MG capsule    Sig: Take 1 capsule (12.5 mg total) by mouth daily.    Dispense:  90 capsule    Refill:  1    Order Specific Question:   Supervising Provider    Answer:   Beatrice Lecher D [[id number]]  . rosuvastatin (CRESTOR) 5 MG tablet    Sig: Take 1 tablet (5 mg total) by mouth daily.    Dispense:  90 tablet    Refill:  1    Order Specific Question:   Supervising Provider    Answer:   Beatrice Lecher D [[id number]]    Follow-up: Return in about 6 months (around 10/31/2020) for HLD, BP; lab visit in 12 weeks for cmp, lipid panel.   Note:  This note was prepared with assistance of Dragon voice recognition software. Occasional wrong-word or sound-a-like substitutions may have occurred due to the inherent limitations of voice recognition software.  Lorrene Reid, PA-C

## 2020-05-01 NOTE — Patient Instructions (Signed)

## 2020-05-06 ENCOUNTER — Encounter: Payer: Self-pay | Admitting: Internal Medicine

## 2020-05-14 ENCOUNTER — Other Ambulatory Visit: Payer: Self-pay

## 2020-05-14 ENCOUNTER — Ambulatory Visit: Payer: 59

## 2020-05-14 VITALS — Ht 72.0 in | Wt 205.0 lb

## 2020-05-14 DIAGNOSIS — Z1211 Encounter for screening for malignant neoplasm of colon: Secondary | ICD-10-CM

## 2020-05-14 MED ORDER — SUTAB 1479-225-188 MG PO TABS: 12.0000 | ORAL_TABLET | ORAL | 0 refills | Status: DC

## 2020-05-14 NOTE — Progress Notes (Signed)
Pt verified name, DOB, address and insurance during PV today.   Mailed instruction packet  copy of consent form to read and not return, and instructions. [employer] coupon mailed in packet. PV completed over the phone. Pt encouraged to call with questions or issues.   No allergies to soy or egg Pt is not on blood thinners or diet pills Denies issues with sedation/intubation Denies atrial flutter/fib Denies constipation    Pt is aware of Covid safety and care partner requirements.  Pt has appt with ortho on 05/18/20 and will check to see if there would be any issues with having the colon 5/11.  Currently riding 22 miles/day on bicycle.

## 2020-05-21 ENCOUNTER — Encounter: Payer: Self-pay | Admitting: Orthopaedic Surgery

## 2020-05-21 ENCOUNTER — Ambulatory Visit (INDEPENDENT_AMBULATORY_CARE_PROVIDER_SITE_OTHER): Payer: 59 | Admitting: Orthopaedic Surgery

## 2020-05-21 ENCOUNTER — Telehealth: Payer: Self-pay | Admitting: Orthopaedic Surgery

## 2020-05-21 DIAGNOSIS — Z96642 Presence of left artificial hip joint: Secondary | ICD-10-CM

## 2020-05-21 NOTE — Telephone Encounter (Signed)
Opal Sidles from [facility] called requesting inform of pre medication for pt to have dental work. Pt had hip replacement surgery on March 10, of this year. Please call Opal Sidles at [phone].

## 2020-05-21 NOTE — Telephone Encounter (Signed)
Jane aware we do antibiotics for three months after surgery

## 2020-05-21 NOTE — Progress Notes (Signed)
The patient is now 6-week status post a left total hip arthroplasty.  He is doing great overall.  There is still some swelling at the left hip incision but no seroma.  His leg lengths are equal.  He tolerates moving his hip around easily.  He is a very active and young 50 year old.  He does have a colonoscopy coming up.  I have no problem with him having a colonoscopy and laying on his left side for that.  He did have a dentist office visit today.  They are going to call in antibiotics for that.  I told him after this visit that I would not require antibiotics for regular routine dental cleanings from here on out.  We will see him back in 6 months.  If there is any issues before then he will let us know.  At that visit I like a standing low AP pelvis and lateral of his left operative hip.  He knows the only thing I want him to do is avoid running for now.

## 2020-05-22 ENCOUNTER — Telehealth: Payer: Self-pay | Admitting: *Deleted

## 2020-05-22 NOTE — Telephone Encounter (Signed)
TE to patient.  Patient states that he was released and given OK for colonoscopy per DR [facility] 05/21/2020.

## 2020-06-02 ENCOUNTER — Encounter: Payer: Self-pay | Admitting: Internal Medicine

## 2020-06-04 ENCOUNTER — Encounter: Payer: Self-pay | Admitting: Internal Medicine

## 2020-06-04 ENCOUNTER — Other Ambulatory Visit: Payer: Self-pay

## 2020-06-04 ENCOUNTER — Ambulatory Visit (AMBULATORY_SURGERY_CENTER): Payer: 59 | Admitting: Internal Medicine

## 2020-06-04 VITALS — BP 101/48 | HR 50 | Temp 97.0°F | Resp 9 | Ht 72.0 in | Wt 205.0 lb

## 2020-06-04 DIAGNOSIS — D122 Benign neoplasm of ascending colon: Secondary | ICD-10-CM

## 2020-06-04 DIAGNOSIS — Z1211 Encounter for screening for malignant neoplasm of colon: Secondary | ICD-10-CM | POA: Diagnosis present

## 2020-06-04 MED ORDER — SODIUM CHLORIDE 0.9 % IV SOLN: 500.0000 mL | Freq: Once | INTRAVENOUS | Status: DC

## 2020-06-04 NOTE — Progress Notes (Signed)
Called to room to assist during endoscopic procedure.  Patient ID and intended procedure confirmed with present staff. Received instructions for my participation in the procedure from the performing physician.  

## 2020-06-04 NOTE — Progress Notes (Signed)
pt tolerated well. VSS. awake and to recovery. Report given to RN.  

## 2020-06-04 NOTE — Patient Instructions (Signed)
Handout on polyps and diverticulosis given    YOU HAD AN ENDOSCOPIC PROCEDURE TODAY AT THE [facility]:   Refer to the procedure report that was given to you for any specific questions about what was found during the examination.  If the procedure report does not answer your questions, please call your gastroenterologist to clarify.  If you requested that your care partner not be given the details of your procedure findings, then the procedure report has been included in a sealed envelope for you to review at your convenience later.  YOU SHOULD EXPECT: Some feelings of bloating in the abdomen. Passage of more gas than usual.  Walking can help get rid of the air that was put into your GI tract during the procedure and reduce the bloating. If you had a lower endoscopy (such as a colonoscopy or flexible sigmoidoscopy) you may notice spotting of blood in your stool or on the toilet paper. If you underwent a bowel prep for your procedure, you may not have a normal bowel movement for a few days.  Please Note:  You might notice some irritation and congestion in your nose or some drainage.  This is from the oxygen used during your procedure.  There is no need for concern and it should clear up in a day or so.  SYMPTOMS TO REPORT IMMEDIATELY:   Following lower endoscopy (colonoscopy or flexible sigmoidoscopy):  Excessive amounts of blood in the stool  Significant tenderness or worsening of abdominal pains  Swelling of the abdomen that is new, acute  Fever of 100F or higher   For urgent or emergent issues, a gastroenterologist can be reached at any hour by calling [phone]. Do not use MyChart messaging for urgent concerns.    DIET:  We do recommend a small meal at first, but then you may proceed to your regular diet.  Drink plenty of fluids but you should avoid alcoholic beverages for 24 hours.  ACTIVITY:  You should plan to take it easy for the rest of today and you should NOT  DRIVE or use heavy machinery until tomorrow (because of the sedation medicines used during the test).    FOLLOW UP: Our staff will call the number listed on your records 48-72 hours following your procedure to check on you and address any questions or concerns that you may have regarding the information given to you following your procedure. If we do not reach you, we will leave a message.  We will attempt to reach you two times.  During this call, we will ask if you have developed any symptoms of COVID 19. If you develop any symptoms (ie: fever, flu-like symptoms, shortness of breath, cough etc.) before then, please call [phone].  If you test positive for Covid 19 in the 2 weeks post procedure, please call and report this information to us.    If any biopsies were taken you will be contacted by phone or by letter within the next 1-3 weeks.  Please call us at [phone] if you have not heard about the biopsies in 3 weeks.    SIGNATURES/CONFIDENTIALITY: You and/or your care partner have signed paperwork which will be entered into your electronic medical record.  These signatures attest to the fact that that the information above on your After Visit Summary has been reviewed and is understood.  Full responsibility of the confidentiality of this discharge information lies with you and/or your care-partner. 

## 2020-06-04 NOTE — Progress Notes (Signed)
Pt's states no medical or surgical changes since previsit or office visit.   CW vitals and SM IV. 

## 2020-06-04 NOTE — Op Note (Signed)
[place] Patient Name: Canon Gola Procedure Date: 06/04/2020 10:13 AM MRN: [id number] Endoscopist: Jerene Bears , MD Age: 50 Referring MD:  Date of Birth: 07/11/1970 Gender: Male Account #: [id number] Procedure:                Colonoscopy Indications:              Screening for colorectal malignant neoplasm, This                            is the patient's first colonoscopy Medicines:                Monitored Anesthesia Care Procedure:                Pre-Anesthesia Assessment:                           - Prior to the procedure, a History and Physical                            was performed, and patient medications and                            allergies were reviewed. The patient's tolerance of                            previous anesthesia was also reviewed. The risks                            and benefits of the procedure and the sedation                            options and risks were discussed with the patient.                            All questions were answered, and informed consent                            was obtained. Prior Anticoagulants: The patient has                            taken no previous anticoagulant or antiplatelet                            agents. ASA Grade Assessment: II - A patient with                            mild systemic disease. After reviewing the risks                            and benefits, the patient was deemed in                            satisfactory condition to undergo the procedure.  After obtaining informed consent, the colonoscope                            was passed under direct vision. Throughout the                            procedure, the patient's blood pressure, pulse, and                            oxygen saturations were monitored continuously. The                            Olympus [id number]) [id number] was introduced                            through the anus and  advanced to the terminal                            ileum. The colonoscopy was performed without                            difficulty. The patient tolerated the procedure                            well. The quality of the bowel preparation was                            good. The ileocecal valve, appendiceal orifice, and                            rectum were photographed. Scope In: 10:25:22 AM Scope Out: 10:40:45 AM Scope Withdrawal Time: 0 hours 12 minutes 11 seconds  Total Procedure Duration: 0 hours 15 minutes 23 seconds  Findings:                 The digital rectal exam was normal.                           The terminal ileum appeared normal.                           A 3 mm polyp was found in the ascending colon. The                            polyp was sessile. The polyp was removed with a                            cold snare. Resection and retrieval were complete.                           Multiple small and large-mouthed diverticula were                            found in the sigmoid colon, hepatic flexure and  ascending colon.                           Internal hemorrhoids were found during                            retroflexion. The hemorrhoids were small. Complications:            No immediate complications. Estimated Blood Loss:     Estimated blood loss: none. Impression:               - The examined portion of the ileum was normal.                           - One 3 mm polyp in the ascending colon, removed                            with a cold snare. Resected and retrieved.                           - Diverticulosis in the sigmoid colon, at the                            hepatic flexure and in the ascending colon.                           - Internal hemorrhoids. Recommendation:           - Patient has a contact number available for                            emergencies. The signs and symptoms of potential                            delayed  complications were discussed with the                            patient. Return to normal activities tomorrow.                            Written discharge instructions were provided to the                            patient.                           - Resume previous diet.                           - Continue present medications.                           - Await pathology results.                           - Repeat colonoscopy is recommended. The  colonoscopy date will be determined after pathology                            results from today's exam become available for                            review. Jerene Bears, MD 06/04/2020 10:42:49 AM This report has been signed electronically.

## 2020-06-06 ENCOUNTER — Telehealth: Payer: Self-pay

## 2020-06-06 NOTE — Telephone Encounter (Signed)
LVM

## 2020-06-17 ENCOUNTER — Encounter: Payer: Self-pay | Admitting: Internal Medicine

## 2020-07-21 ENCOUNTER — Other Ambulatory Visit: Payer: Self-pay | Admitting: Physician Assistant

## 2020-07-21 DIAGNOSIS — R030 Elevated blood-pressure reading, without diagnosis of hypertension: Secondary | ICD-10-CM

## 2020-07-21 DIAGNOSIS — E785 Hyperlipidemia, unspecified: Secondary | ICD-10-CM

## 2020-07-22 ENCOUNTER — Other Ambulatory Visit: Payer: Self-pay

## 2020-07-22 ENCOUNTER — Other Ambulatory Visit: Payer: 59

## 2020-07-22 DIAGNOSIS — E785 Hyperlipidemia, unspecified: Secondary | ICD-10-CM

## 2020-07-22 DIAGNOSIS — R030 Elevated blood-pressure reading, without diagnosis of hypertension: Secondary | ICD-10-CM

## 2020-07-23 LAB — COMPREHENSIVE METABOLIC PANEL
ALT: 26 IU/L (ref 0–44)
AST: 27 IU/L (ref 0–40)
Albumin/Globulin Ratio: 2 (ref 1.2–2.2)
Albumin: 4.7 g/dL (ref 3.8–4.9)
Alkaline Phosphatase: 125 IU/L — ABNORMAL HIGH (ref 44–121)
BUN/Creatinine Ratio: 11 (ref 9–20)
BUN: 12 mg/dL (ref 6–24)
Bilirubin Total: 0.6 mg/dL (ref 0.0–1.2)
CO2: 24 mmol/L (ref 20–29)
Calcium: 9.9 mg/dL (ref 8.7–10.2)
Chloride: 102 mmol/L (ref 96–106)
Creatinine, Ser: 1.1 mg/dL (ref 0.76–1.27)
Globulin, Total: 2.3 g/dL (ref 1.5–4.5)
Glucose: 91 mg/dL (ref 65–99)
Potassium: 4.6 mmol/L (ref 3.5–5.2)
Sodium: 140 mmol/L (ref 134–144)
Total Protein: 7 g/dL (ref 6.0–8.5)
eGFR: 81 mL/min/{1.73_m2} (ref >59)

## 2020-07-23 LAB — LIPID PANEL
Chol/HDL Ratio: 4 ratio (ref 0.0–5.0)
Cholesterol, Total: 174 mg/dL (ref 100–199)
HDL: 44 mg/dL (ref >39)
LDL Chol Calc (NIH): 110 mg/dL — ABNORMAL HIGH (ref 0–99)
Triglycerides: 110 mg/dL (ref 0–149)
VLDL Cholesterol Cal: 20 mg/dL (ref 5–40)

## 2020-07-25 ENCOUNTER — Emergency Department: Payer: 59

## 2020-07-25 ENCOUNTER — Emergency Department
Admission: EM | Admit: 2020-07-25 | Discharge: 2020-07-25 | Disposition: A | Discharge status: Home / Self Care | Payer: 59 | Attending: Emergency Medicine | Admitting: Emergency Medicine

## 2020-07-25 ENCOUNTER — Other Ambulatory Visit: Payer: Self-pay

## 2020-07-25 DIAGNOSIS — Z96642 Presence of left artificial hip joint: Secondary | ICD-10-CM | POA: Insufficient documentation

## 2020-07-25 DIAGNOSIS — Z23 Encounter for immunization: Secondary | ICD-10-CM | POA: Insufficient documentation

## 2020-07-25 DIAGNOSIS — Z7982 Long term (current) use of aspirin: Secondary | ICD-10-CM | POA: Insufficient documentation

## 2020-07-25 DIAGNOSIS — Z79899 Other long term (current) drug therapy: Secondary | ICD-10-CM | POA: Insufficient documentation

## 2020-07-25 DIAGNOSIS — M25511 Pain in right shoulder: Secondary | ICD-10-CM | POA: Diagnosis not present

## 2020-07-25 DIAGNOSIS — R42 Dizziness and giddiness: Secondary | ICD-10-CM | POA: Insufficient documentation

## 2020-07-25 DIAGNOSIS — Z87891 Personal history of nicotine dependence: Secondary | ICD-10-CM | POA: Diagnosis not present

## 2020-07-25 DIAGNOSIS — Y92410 Unspecified street and highway as the place of occurrence of the external cause: Secondary | ICD-10-CM | POA: Diagnosis not present

## 2020-07-25 DIAGNOSIS — I10 Essential (primary) hypertension: Secondary | ICD-10-CM | POA: Diagnosis not present

## 2020-07-25 DIAGNOSIS — M25551 Pain in right hip: Secondary | ICD-10-CM | POA: Diagnosis not present

## 2020-07-25 DIAGNOSIS — Z85828 Personal history of other malignant neoplasm of skin: Secondary | ICD-10-CM | POA: Diagnosis not present

## 2020-07-25 MED ORDER — KETOROLAC TROMETHAMINE 30 MG/ML IJ SOLN
30.0000 mg | Freq: Once | INTRAMUSCULAR | Status: AC
  Administered 2020-07-25: 30 mg via INTRAVENOUS
  Filled 2020-07-25: qty 1

## 2020-07-25 MED ORDER — TETANUS-DIPHTH-ACELL PERTUSSIS 5-2.5-18.5 LF-MCG/0.5 IM SUSY
0.5000 mL | PREFILLED_SYRINGE | Freq: Once | INTRAMUSCULAR | Status: AC
  Administered 2020-07-25: 0.5 mL via INTRAMUSCULAR
  Filled 2020-07-25: qty 0.5

## 2020-07-25 MED ORDER — ACETAMINOPHEN 500 MG PO TABS
1000.0000 mg | ORAL_TABLET | Freq: Once | ORAL | Status: AC
  Administered 2020-07-25: 1000 mg via ORAL
  Filled 2020-07-25: qty 2
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
## 2020-07-25 NOTE — Discharge Instructions (Addendum)
Please use Tylenol and Ibuprofen alternating for symptom relief. Follow up with orthopedics if no improvement in symptoms. Return to the ER for repeat evaluation if any worsening.
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
## 2020-09-06 ENCOUNTER — Other Ambulatory Visit: Payer: Self-pay | Admitting: Physician Assistant

## 2020-09-06 DIAGNOSIS — R030 Elevated blood-pressure reading, without diagnosis of hypertension: Secondary | ICD-10-CM

## 2020-10-28 ENCOUNTER — Other Ambulatory Visit: Payer: Self-pay | Admitting: Physician Assistant

## 2020-10-28 DIAGNOSIS — R030 Elevated blood-pressure reading, without diagnosis of hypertension: Secondary | ICD-10-CM

## 2020-10-29 ENCOUNTER — Other Ambulatory Visit: Payer: Self-pay

## 2020-10-29 ENCOUNTER — Other Ambulatory Visit: Payer: 59

## 2020-10-29 DIAGNOSIS — E785 Hyperlipidemia, unspecified: Secondary | ICD-10-CM

## 2020-10-29 NOTE — Addendum Note (Signed)
Addended by: Vivia Birmingham on: 10/29/2020 09:12 AM   Modules accepted: Orders

## 2020-10-30 ENCOUNTER — Encounter: Payer: Self-pay | Admitting: Physician Assistant

## 2020-10-30 ENCOUNTER — Ambulatory Visit (INDEPENDENT_AMBULATORY_CARE_PROVIDER_SITE_OTHER): Payer: 59 | Admitting: Physician Assistant

## 2020-10-30 VITALS — BP 111/76 | HR 90 | Temp 97.9°F | Ht 72.0 in | Wt 209.0 lb

## 2020-10-30 DIAGNOSIS — R030 Elevated blood-pressure reading, without diagnosis of hypertension: Secondary | ICD-10-CM | POA: Diagnosis not present

## 2020-10-30 DIAGNOSIS — E785 Hyperlipidemia, unspecified: Secondary | ICD-10-CM

## 2020-10-30 LAB — COMPREHENSIVE METABOLIC PANEL
ALT: 34 IU/L (ref 0–44)
AST: 31 IU/L (ref 0–40)
Albumin/Globulin Ratio: 2 (ref 1.2–2.2)
Albumin: 4.7 g/dL (ref 3.8–4.9)
Alkaline Phosphatase: 109 IU/L (ref 44–121)
BUN/Creatinine Ratio: 20 (ref 9–20)
BUN: 21 mg/dL (ref 6–24)
Bilirubin Total: 0.7 mg/dL (ref 0.0–1.2)
CO2: 25 mmol/L (ref 20–29)
Calcium: 9.7 mg/dL (ref 8.7–10.2)
Chloride: 101 mmol/L (ref 96–106)
Creatinine, Ser: 1.03 mg/dL (ref 0.76–1.27)
Globulin, Total: 2.3 g/dL (ref 1.5–4.5)
Glucose: 101 mg/dL — ABNORMAL HIGH (ref 70–99)
Potassium: 4.9 mmol/L (ref 3.5–5.2)
Sodium: 140 mmol/L (ref 134–144)
Total Protein: 7 g/dL (ref 6.0–8.5)
eGFR: 87 mL/min/{1.73_m2} (ref >59)

## 2020-10-30 LAB — LIPID PANEL
Chol/HDL Ratio: 5.4 ratio — ABNORMAL HIGH (ref 0.0–5.0)
Cholesterol, Total: 201 mg/dL — ABNORMAL HIGH (ref 100–199)
HDL: 37 mg/dL — ABNORMAL LOW (ref >39)
LDL Chol Calc (NIH): 137 mg/dL — ABNORMAL HIGH (ref 0–99)
Triglycerides: 151 mg/dL — ABNORMAL HIGH (ref 0–149)
VLDL Cholesterol Cal: 27 mg/dL (ref 5–40)

## 2020-10-30 MED ORDER — ROSUVASTATIN CALCIUM 5 MG PO TABS: 5.0000 mg | ORAL_TABLET | Freq: Every day | ORAL | 3 refills | Status: DC

## 2020-10-30 NOTE — Progress Notes (Signed)
Established Patient Office Visit  Subjective:  Patient ID: Randy Bell, male    DOB: [birth year]  Age: 50 y.o. MRN: [id number]  CC:  Chief Complaint  Patient presents with   Follow-up   Hyperlipidemia   Hypertension    HPI Randy Bell presents for follow up on hypertension and hyperlipidemia. Patient has no acute concerns today.  HTN: Pt denies chest pain, palpitations, dizziness or lower extremity swelling. Taking medication as directed without side effects. Patient reports stays well hydrated. Tries to monitor sodium intake.   HLD: Pt taking medication as directed without issues. Denies side effects including myalgias or muscle weakness. States no major diet changes. Recently had some pork several times before getting labs.     Past Medical History:  Diagnosis Date   Arthritis    Basal cell carcinoma    Neck   Dizziness    Hyperlipidemia    Hypertension     Past Surgical History:  Procedure Laterality Date   BASAL CELL CARCINOMA EXCISION     Neck   COLONOSCOPY     FOOT SURGERY     TOTAL HIP ARTHROPLASTY Left 04/03/2020   Procedure: LEFT TOTAL HIP ARTHROPLASTY ANTERIOR APPROACH;  Surgeon: Mcarthur Rossetti, MD;  Location: [facility];  Service: Orthopedics;  Laterality: Left;    Family History  Problem Relation Age of Onset   Hyperlipidemia Mother    Hyperlipidemia Father    Stroke Father    Colon cancer Neg Hx    Colon polyps Neg Hx    Esophageal cancer Neg Hx    Rectal cancer Neg Hx    Stomach cancer Neg Hx     Social History   Socioeconomic History   Marital status: Married    Spouse name: Not on file   Number of children: Not on file   Years of education: Not on file   Highest education level: Not on file  Occupational History   Not on file  Tobacco Use   Smoking status: Former    Years: 3.00    Types: Cigarettes   Smokeless tobacco: Former   Tobacco comments:    quit 30 years   Vaping Use   Vaping Use: Never used  Substance and Sexual  Activity   Alcohol use: Yes    Alcohol/week: 6.0 standard drinks    Types: 6 Standard drinks or equivalent per week   Drug use: Never   Sexual activity: Not on file  Other Topics Concern   Not on file  Social History Narrative   Not on file   Social Determinants of Health   Financial Resource Strain: Not on file  Food Insecurity: Not on file  Transportation Needs: Not on file  Physical Activity: Not on file  Stress: Not on file  Social Connections: Not on file  Intimate Partner Violence: Not on file    Outpatient Medications Prior to Visit  Medication Sig Dispense Refill   Ascorbic Acid (VITAMIN C PO) Take 1 tablet by mouth daily.     Glucosamine HCl (GLUCOSAMINE PO) Take 1 tablet by mouth 2 (two) times daily.     hydrochlorothiazide (MICROZIDE) 12.5 MG capsule Take 1 capsule by mouth once daily 90 capsule 1   Multiple Vitamin (MULTIVITAMIN WITH MINERALS) TABS tablet Take 1 tablet by mouth daily.     Omega-3 Fatty Acids (FISH OIL PO) Take 1 capsule by mouth 2 (two) times daily.     rosuvastatin (CRESTOR) 5 MG tablet Take 1 tablet (5 mg total)  by mouth daily. 90 tablet 1   acetaminophen (TYLENOL) 500 MG tablet Take 500 mg by mouth every 8 (eight) hours as needed for moderate pain.     aspirin 81 MG chewable tablet Chew 1 tablet (81 mg total) by mouth 2 (two) times daily. (Patient not taking: No sig reported) 30 tablet 0   CINNAMON PO Take 1 tablet by mouth 2 (two) times daily.     ibuprofen (ADVIL) 200 MG tablet Take 4 tablets (800 mg total) by mouth every 8 (eight) hours as needed for moderate pain or headache. 30 tablet 0   MELATONIN PO Take 1 capsule by mouth at bedtime as needed (sleep).     VITAMIN D PO Take 1 tablet by mouth daily.     No facility-administered medications prior to visit.    No Known Allergies  ROS Review of Systems A fourteen system review of systems was performed and found to be positive as per HPI.   Objective:    Physical Exam General:  Well  Developed, well nourished, appropriate for stated age.  Neuro:  Alert and oriented,  extra-ocular muscles intact  HEENT:  Normocephalic, atraumatic, neck supple Skin:  no gross rash, warm, pink. Cardiac:  RRR, S1 S2 Respiratory:  CTA B/L and A/P, Not using accessory muscles, speaking in full sentences- unlabored. Vascular:  Ext warm, no cyanosis apprec.; cap RF less 2 sec. No edema  Psych:  No HI/SI, judgement and insight good, Euthymic mood. Full Affect.   BP 111/76   Pulse 90   Temp 97.9 F (36.6 C)   Ht 6' (1.829 m)   Wt 209 lb (94.8 kg)   SpO2 97%   BMI 28.35 kg/m  Wt Readings from Last 3 Encounters:  10/30/20 209 lb (94.8 kg)  07/25/20 205 lb (93 kg)  06/04/20 205 lb (93 kg)     Health Maintenance Due  Topic Date Due   Zoster Vaccines- Shingrix (1 of 2) Never done   COVID-19 Vaccine (3 - Booster for Pfizer series) 10/01/2019   INFLUENZA VACCINE  08/25/2020    There are no preventive care reminders to display for this patient.  No results found for: TSH Lab Results  Component Value Date   WBC 11.9 (H) 04/04/2020   HGB 12.1 (L) 04/04/2020   HCT 36.3 (L) 04/04/2020   MCV 90.3 04/04/2020   PLT 236 04/04/2020   Lab Results  Component Value Date   NA 140 10/29/2020   K 4.9 10/29/2020   CO2 25 10/29/2020   GLUCOSE 101 (H) 10/29/2020   BUN 21 10/29/2020   CREATININE 1.03 10/29/2020   BILITOT 0.7 10/29/2020   ALKPHOS 109 10/29/2020   AST 31 10/29/2020   ALT 34 10/29/2020   PROT 7.0 10/29/2020   ALBUMIN 4.7 10/29/2020   CALCIUM 9.7 10/29/2020   ANIONGAP 9 04/04/2020   EGFR 87 10/29/2020   Lab Results  Component Value Date   CHOL 201 (H) 10/29/2020   Lab Results  Component Value Date   HDL 37 (L) 10/29/2020   Lab Results  Component Value Date   LDLCALC 137 (H) 10/29/2020   Lab Results  Component Value Date   TRIG 151 (H) 10/29/2020   Lab Results  Component Value Date   CHOLHDL 5.4 (H) 10/29/2020   Lab Results  Component Value Date   HGBA1C  5.4 09/19/2018      Assessment & Plan:   Problem List Items Addressed This Visit       Other  Elevated blood pressure reading without diagnosis of hypertension   Hyperlipidemia - Primary   Relevant Medications   rosuvastatin (CRESTOR) 5 MG tablet   Hyperlipidemia: -Discussed most recent lipid panel which has increased from prior.  -The 10-year ASCVD risk score (Arnett DK, et al., 2019) is: 4.5% -Will continue current medication regimen and discussed low fat diet. Continue with physical activity. -Recent hepatic function normal. -Will continue to monitor.  Elevated blood pressure reading without diagnosis of hypertension: -Patient was started on HCTZ by cardiology.  -BP controlled. Will continue current medication regimen. Recent renal function and electrolytes normal.   Meds ordered this encounter  Medications   rosuvastatin (CRESTOR) 5 MG tablet    Sig: Take 1 tablet (5 mg total) by mouth daily.    Dispense:  90 tablet    Refill:  3    Order Specific Question:   Supervising Provider    Answer:   Beatrice Lecher D [[id number]]    Follow-up: Return in about 1 year (around 10/30/2021) for CPE and FBW .    Lorrene Reid, PA-C

## 2020-11-20 ENCOUNTER — Encounter: Payer: Self-pay | Admitting: Orthopaedic Surgery

## 2020-11-20 ENCOUNTER — Ambulatory Visit (INDEPENDENT_AMBULATORY_CARE_PROVIDER_SITE_OTHER): Payer: 59 | Admitting: Orthopaedic Surgery

## 2020-11-20 ENCOUNTER — Ambulatory Visit: Payer: Self-pay

## 2020-11-20 DIAGNOSIS — Z96642 Presence of left artificial hip joint: Secondary | ICD-10-CM | POA: Diagnosis not present

## 2020-11-20 NOTE — Progress Notes (Signed)
The patient is well-known to me.  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  He has some arthritis in his right hip with some evidence of femoral anterolateral impingement on x-ray but he still has no symptoms with any issues with his right hip at all.  He says his left hip is doing great and he has no issues overall.  His leg lengths are equal.  Both hips move smoothly and fluidly with no pain in the groin at all.  An AP pelvis and lateral of the left hip shows a well-seated total hip arthroplasty that is bone ingrown.  There is no complicating features.  His offset and leg lengths look good and I am pleased with the position of the implant.  He does have femoral asked to impingement on the right side.  He has no restrictions from my standpoint.  Since he is still asymptomatic with the right hip he knows to let us know if that worsens in any way.  All questions and concerns were answered and addressed.  Follow-up at this point for the left hip is as needed.

## 2020-12-06 ENCOUNTER — Other Ambulatory Visit: Payer: Self-pay | Admitting: Physician Assistant

## 2020-12-06 DIAGNOSIS — E785 Hyperlipidemia, unspecified: Secondary | ICD-10-CM

## 2021-03-25 ENCOUNTER — Encounter: Payer: Self-pay | Admitting: Orthopaedic Surgery

## 2021-03-25 ENCOUNTER — Other Ambulatory Visit: Payer: Self-pay | Admitting: Orthopaedic Surgery

## 2021-03-25 ENCOUNTER — Ambulatory Visit (INDEPENDENT_AMBULATORY_CARE_PROVIDER_SITE_OTHER): Payer: 59 | Admitting: Orthopaedic Surgery

## 2021-03-25 ENCOUNTER — Ambulatory Visit (INDEPENDENT_AMBULATORY_CARE_PROVIDER_SITE_OTHER): Payer: 59

## 2021-03-25 DIAGNOSIS — M25562 Pain in left knee: Secondary | ICD-10-CM

## 2021-03-25 DIAGNOSIS — M25462 Effusion, left knee: Secondary | ICD-10-CM | POA: Diagnosis not present

## 2021-03-25 DIAGNOSIS — G8929 Other chronic pain: Secondary | ICD-10-CM | POA: Diagnosis not present

## 2021-03-25 MED ORDER — LIDOCAINE HCL 1 % IJ SOLN
3.0000 mL | INTRAMUSCULAR | Status: AC | PRN
  Administered 2021-03-25: 3 mL

## 2021-03-25 MED ORDER — METHYLPREDNISOLONE ACETATE 40 MG/ML IJ SUSP
40.0000 mg | INTRAMUSCULAR | Status: AC | PRN
  Administered 2021-03-25: 40 mg via INTRA_ARTICULAR

## 2021-03-25 NOTE — Progress Notes (Signed)
? ?Office Visit Note ?  ?Patient: Randy Bell           ?Date of Birth: Oct 28, 1970           ?MRN: [id number] ?Visit Date: 03/25/2021 ?             ?Requested by: Lorrene Reid, PA-C ?[address] ?[address] ?[place],  [place] [address] ?PCP: Lorrene Reid, PA-C ? ? ?Assessment & Plan: ?Visit Diagnoses:  ?1. Chronic pain of left knee   ?2. Effusion, left knee   ? ? ?Plan: I was able to aspirate fluid from his left knee and got about 40 cc of fluid off the knee.  We will send this for cell count and crystal analysis.  This will be to rule out gout.  This does not appear to be infected.  I did place a steroid knee and he felt much better after the aspiration with injection.  I will see him back in 3 weeks for repeat exam but no x-rays are needed.  All questions and concerns were answered and addressed. ? ?Follow-Up Instructions: Return in about 3 weeks (around 04/15/2021).  ? ?Orders:  ?Orders Placed This Encounter  ?Procedures  ? Large Joint Inj  ? XR Knee 1-2 Views Left  ? ?No orders of the defined types were placed in this encounter. ? ? ? ? Procedures: ?Large Joint Inj: L knee on 03/25/2021 3:19 PM ?Indications: diagnostic evaluation and pain ?Details: 22 G 1.5 in needle, superolateral approach ? ?Arthrogram: No ? ?Medications: 3 mL lidocaine 1 %; 40 mg methylPREDNISolone acetate 40 MG/ML ?Outcome: tolerated well, no immediate complications ?Procedure, treatment alternatives, risks and benefits explained, specific risks discussed. Consent was given by the patient. Immediately prior to procedure a time out was called to verify the correct patient, procedure, equipment, support staff and site/side marked as required. Patient was prepped and draped in the usual sterile fashion.  ? ? ? ? ?Clinical Data: ?No additional findings. ? ? ?Subjective: ?Chief Complaint  ?Patient presents with  ? Left Knee - Pain  ?The patient comes in today with acute left knee pain and swelling this been going on since New Year's Eve New  Year's Day with no known injury.  He has never had this type of swelling before.  Actually replaced his left hip in 2022.  That is done well for him.  He denies any fever and chills or any surgery in the past on that left knee.  It is stayed swollen and painful for him.  He says it stays warm as well.  He has taken some anti-inflammatory ? ?HPI ? ?Review of Systems ? ? ?Objective: ?Vital Signs: There were no vitals taken for this visit. ? ?Physical Exam ? ?Ortho Exam ?Examination of the left knee does show a moderate effusion.  There is painful arc of motion of the knee.  There is no redness.  The knee is slightly warm.  The knee is ligamentously stable with again full motion.  There is posterior medial and medial tenderness. ?Specialty Comments:  ?No specialty comments available. ? ?Imaging: ?XR Knee 1-2 Views Left ? ?Result Date: 03/25/2021 ?2 views of the left knee showed no acute findings.  There is a slight effusion that can be seen on plain film.  The alignment of the knee is near anatomic.  There is evidence of Teacher, early years/pre.  ? ? ?PMFS History: ?Patient Active Problem List  ? Diagnosis Date Noted  ? 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 Episodic lightheadedness 04/26/2019  ? Elevated blood pressure reading without diagnosis of hypertension 10/05/2018  ? Hyperlipidemia 10/05/2018  ? Family history of stroke or transient ischemic attack in father 08/01/2018  ? Family history of hyperlipidemia- Mom and Dad 08/01/2018  ? Bradycardia, unspecified 08/01/2018  ? Fluttering sensation of heart- with rest and lasts 2-3 sec 08/01/2018  ? 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 08/01/2018  ? Primary osteoarthritis of left hip 05/16/2018  ? ?Past Medical History:  ?Diagnosis Date  ? Arthritis   ? Basal cell carcinoma   ? Neck  ? Dizziness   ? Hyperlipidemia   ? Hypertension   ?  ?Family History  ?Problem Relation Age of Onset  ? Hyperlipidemia Mother   ? Hyperlipidemia Father   ? Stroke Father   ? Colon  cancer Neg Hx   ? Colon polyps Neg Hx   ? Esophageal cancer Neg Hx   ? Rectal cancer Neg Hx   ? Stomach cancer Neg Hx   ?  ?Past Surgical History:  ?Procedure Laterality Date  ? BASAL CELL CARCINOMA EXCISION    ? Neck  ? COLONOSCOPY    ? FOOT SURGERY    ? TOTAL HIP ARTHROPLASTY Left 04/03/2020  ? Procedure: LEFT TOTAL HIP ARTHROPLASTY ANTERIOR APPROACH;  Surgeon: Mcarthur Rossetti, MD;  Location: [facility];  Service: Orthopedics;  Laterality: Left;  ? ?Social History  ? ?Occupational History  ? Not on file  ?Tobacco Use  ? Smoking status: Former  ?  Years: 3.00  ?  Types: Cigarettes  ? Smokeless tobacco: Former  ? Tobacco comments:  ?  quit 30 years   ?Vaping Use  ? Vaping Use: Never used  ?Substance and Sexual Activity  ? Alcohol use: Yes  ?  Alcohol/week: 6.0 standard drinks  ?  Types: 6 Standard drinks or equivalent per week  ? Drug use: Never  ? Sexual activity: Not on file  ? ? ? ? ? ? ?

## 2021-03-26 LAB — SYNOVIAL FLUID ANALYSIS, COMPLETE
Basophils, %: 0 %
Eosinophils-Synovial: 0 % (ref 0–2)
Lymphocytes-Synovial Fld: 75 % — ABNORMAL HIGH (ref 0–74)
Monocyte/Macrophage: 15 % (ref 0–69)
Neutrophil, Synovial: 10 % (ref 0–24)
Synoviocytes, %: 0 % (ref 0–15)
WBC, Synovial: 200 cells/uL — ABNORMAL HIGH (ref <150)

## 2021-04-01 ENCOUNTER — Other Ambulatory Visit: Payer: Self-pay

## 2021-04-01 ENCOUNTER — Ambulatory Visit (INDEPENDENT_AMBULATORY_CARE_PROVIDER_SITE_OTHER): Payer: 59 | Admitting: Orthopaedic Surgery

## 2021-04-01 ENCOUNTER — Encounter: Payer: Self-pay | Admitting: Orthopaedic Surgery

## 2021-04-01 DIAGNOSIS — M25462 Effusion, left knee: Secondary | ICD-10-CM

## 2021-04-01 DIAGNOSIS — M25562 Pain in left knee: Secondary | ICD-10-CM | POA: Diagnosis not present

## 2021-04-01 DIAGNOSIS — G8929 Other chronic pain: Secondary | ICD-10-CM

## 2021-04-01 NOTE — Progress Notes (Signed)
The patient comes in today for continued follow-up as a relates to his left knee.  He injured this knee on 1 January of this year.  He has tried conservative treatment and activity modification for over 2 months now.  We have aspirated fluid from his knee a week ago and there was 40 cc of effusion in the knee.  It showed minimal white blood cell count and no crystals but it was bloody suggesting a recurrent injury.  The effusion has reoccurred in just a week.  He is still having locking catching with his left knee and has problems weightbearing on that knee.  He also has problems maintaining full extension and full flexion of the left knee.  He is worked on activity modification and quad strengthening exercises already and has failed conservative treatment for over 2 months now as it relates to the left knee. ? ?On exam today his left knee is warm.  There is a moderate effusion again.  There is a positive Murray sign to the medial compartment.  There is slight laxity in the knee exam overall.  This is concerning. ? ?At this point is medically necessary we obtain an MRI of his left knee given the recurrent effusion and the injury he has had to his knee combined with failure of conservative treatment and his mechanical symptoms.  We will work on getting that ordered and he will rest his knee for now.  He will continue anti-inflammatories.  We will see him in follow-up to go over the MRI of his left knee. ?

## 2021-04-07 ENCOUNTER — Ambulatory Visit
Admission: RE | Admit: 2021-04-07 | Discharge: 2021-04-07 | Disposition: A | Discharge status: Home / Self Care | Payer: 59 | Source: Ambulatory Visit | Attending: Orthopaedic Surgery | Admitting: Orthopaedic Surgery

## 2021-04-07 DIAGNOSIS — G8929 Other chronic pain: Secondary | ICD-10-CM

## 2021-04-07 DIAGNOSIS — M25462 Effusion, left knee: Secondary | ICD-10-CM

## 2021-04-13 ENCOUNTER — Ambulatory Visit: Payer: 59 | Admitting: Orthopaedic Surgery

## 2021-04-15 ENCOUNTER — Ambulatory Visit (INDEPENDENT_AMBULATORY_CARE_PROVIDER_SITE_OTHER): Payer: 59 | Admitting: Orthopaedic Surgery

## 2021-04-15 ENCOUNTER — Encounter: Payer: Self-pay | Admitting: Orthopaedic Surgery

## 2021-04-15 ENCOUNTER — Ambulatory Visit: Payer: 59 | Admitting: Orthopaedic Surgery

## 2021-04-15 DIAGNOSIS — S83242D Other tear of medial meniscus, current injury, left knee, subsequent encounter: Secondary | ICD-10-CM | POA: Diagnosis not present

## 2021-04-15 NOTE — Progress Notes (Signed)
HPI: [name] returns today to go over the MRI of his left knee.  Again he has had knee pain since New Year's Eve after playing corn hole.  He is having recurrent effusion.  Continues to have pain medial aspect of the knee.  Having no significant mechanical symptoms.  States he has to sleep with a pillow between his knees though. ?Left knee MRI shows mild arthritic changes involving the patella medial compartment.  No full-thickness cartilage loss.  Complex tear of the posterior medial meniscus with flipped bucket-handle component.  Moderate effusion. ? ?Physical exam: Left knee slight effusion.  Good range of motion of the knee. ? ?Impression: Complex medial meniscal tear with a flipped/bucket-handle component. ? ?Plan: Given the fact the patient's failed conservative treatment continues to have significant pain in the knee and the root current effusion recommend knee arthroscopy.  This to be a knee arthroscopy partial medial meniscectomy.  Questions were encouraged and answered by Dr. Ninfa Linden myself.  We will work on scheduling this in the near future. ?

## 2021-05-07 ENCOUNTER — Other Ambulatory Visit: Payer: Self-pay | Admitting: Physician Assistant

## 2021-05-07 DIAGNOSIS — R030 Elevated blood-pressure reading, without diagnosis of hypertension: Secondary | ICD-10-CM

## 2021-05-14 ENCOUNTER — Other Ambulatory Visit: Payer: Self-pay | Admitting: Orthopaedic Surgery

## 2021-05-14 DIAGNOSIS — S83232D Complex tear of medial meniscus, current injury, left knee, subsequent encounter: Secondary | ICD-10-CM | POA: Diagnosis not present

## 2021-05-14 MED ORDER — HYDROCODONE-ACETAMINOPHEN 5-325 MG PO TABS: 1.0000 | ORAL_TABLET | Freq: Four times a day (QID) | ORAL | 0 refills | Status: DC | PRN

## 2021-05-21 ENCOUNTER — Ambulatory Visit (INDEPENDENT_AMBULATORY_CARE_PROVIDER_SITE_OTHER): Payer: 59 | Admitting: Orthopaedic Surgery

## 2021-05-21 ENCOUNTER — Encounter: Payer: Self-pay | Admitting: Orthopaedic Surgery

## 2021-05-21 DIAGNOSIS — Z9889 Other specified postprocedural states: Secondary | ICD-10-CM

## 2021-05-21 DIAGNOSIS — S83242D Other tear of medial meniscus, current injury, left knee, subsequent encounter: Secondary | ICD-10-CM

## 2021-05-21 NOTE — Progress Notes (Signed)
The patient is a 51 year old who is 1 week out from a left knee arthroscopy with a partial medial meniscectomy.  He had a significant meniscal tear of his medial meniscus that was complex.  He is having appropriate swelling and pain since surgery.  Fortunately the cartilage look great in his knee except for some patellofemoral arthritic changes but there was no significant cartilage loss in his knee.  His ACL look great and the lateral meniscus look great. ? ?Examination of his left knee does show mild to moderate effusion.  He has good range of motion overall and his knee is stable.  He says his pain is more of a tooth ache type of pain and he does not need to take pain medicine for this he states. ? ?He can get back to biking. He should avoid squats and high impact aerobic activities.  We can see him back in 2 weeks to see if we need to aspirate his knee and placed a steroid injection in his knee if he is uncomfortable and still having swelling.  I did go over his arthroscopy pictures with him as well. ?

## 2021-06-10 ENCOUNTER — Encounter: Payer: 59 | Admitting: Orthopaedic Surgery
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
## 2021-08-12 ENCOUNTER — Other Ambulatory Visit: Payer: Self-pay | Admitting: Physician Assistant

## 2021-08-12 DIAGNOSIS — R030 Elevated blood-pressure reading, without diagnosis of hypertension: Secondary | ICD-10-CM

## 2021-11-10 ENCOUNTER — Other Ambulatory Visit: Payer: 59

## 2021-11-11 ENCOUNTER — Encounter: Payer: 59 | Admitting: Physician Assistant

## 2021-12-07 ENCOUNTER — Other Ambulatory Visit: Payer: Self-pay | Admitting: Physician Assistant

## 2021-12-07 DIAGNOSIS — E785 Hyperlipidemia, unspecified: Secondary | ICD-10-CM

## 2021-12-11 ENCOUNTER — Other Ambulatory Visit: Payer: Self-pay

## 2021-12-11 DIAGNOSIS — R030 Elevated blood-pressure reading, without diagnosis of hypertension: Secondary | ICD-10-CM

## 2021-12-11 DIAGNOSIS — Z125 Encounter for screening for malignant neoplasm of prostate: Secondary | ICD-10-CM

## 2021-12-11 DIAGNOSIS — Z0000 Encounter for general adult medical examination without abnormal findings: Secondary | ICD-10-CM

## 2021-12-11 DIAGNOSIS — E785 Hyperlipidemia, unspecified: Secondary | ICD-10-CM

## 2021-12-15 ENCOUNTER — Other Ambulatory Visit: Payer: 59

## 2021-12-15 DIAGNOSIS — Z125 Encounter for screening for malignant neoplasm of prostate: Secondary | ICD-10-CM

## 2021-12-15 DIAGNOSIS — Z0000 Encounter for general adult medical examination without abnormal findings: Secondary | ICD-10-CM

## 2021-12-15 DIAGNOSIS — E785 Hyperlipidemia, unspecified: Secondary | ICD-10-CM

## 2021-12-15 DIAGNOSIS — R030 Elevated blood-pressure reading, without diagnosis of hypertension: Secondary | ICD-10-CM

## 2021-12-16 LAB — COMPREHENSIVE METABOLIC PANEL
ALT: 20 IU/L (ref 0–44)
AST: 27 IU/L (ref 0–40)
Albumin/Globulin Ratio: 2 (ref 1.2–2.2)
Albumin: 4.7 g/dL (ref 3.8–4.9)
Alkaline Phosphatase: 123 IU/L — ABNORMAL HIGH (ref 44–121)
BUN/Creatinine Ratio: 12 (ref 9–20)
BUN: 13 mg/dL (ref 6–24)
Bilirubin Total: 1 mg/dL (ref 0.0–1.2)
CO2: 24 mmol/L (ref 20–29)
Calcium: 10.2 mg/dL (ref 8.7–10.2)
Chloride: 100 mmol/L (ref 96–106)
Creatinine, Ser: 1.08 mg/dL (ref 0.76–1.27)
Globulin, Total: 2.4 g/dL (ref 1.5–4.5)
Glucose: 101 mg/dL — ABNORMAL HIGH (ref 70–99)
Potassium: 5.1 mmol/L (ref 3.5–5.2)
Sodium: 140 mmol/L (ref 134–144)
Total Protein: 7.1 g/dL (ref 6.0–8.5)
eGFR: 82 mL/min/{1.73_m2} (ref >59)

## 2021-12-16 LAB — HEMOGLOBIN A1C
Est. average glucose Bld gHb Est-mCnc: 108 mg/dL
Hgb A1c MFr Bld: 5.4 % (ref 4.8–5.6)

## 2021-12-16 LAB — LIPID PANEL
Chol/HDL Ratio: 4.5 ratio (ref 0.0–5.0)
Cholesterol, Total: 196 mg/dL (ref 100–199)
HDL: 44 mg/dL (ref >39)
LDL Chol Calc (NIH): 134 mg/dL — ABNORMAL HIGH (ref 0–99)
Triglycerides: 99 mg/dL (ref 0–149)
VLDL Cholesterol Cal: 18 mg/dL (ref 5–40)

## 2021-12-16 LAB — CBC WITH DIFFERENTIAL/PLATELET
Basophils Absolute: 0 10*3/uL (ref 0.0–0.2)
Basos: 1 %
EOS (ABSOLUTE): 0.1 10*3/uL (ref 0.0–0.4)
Eos: 3 %
Hematocrit: 44.8 % (ref 37.5–51.0)
Hemoglobin: 15 g/dL (ref 13.0–17.7)
Immature Grans (Abs): 0 10*3/uL (ref 0.0–0.1)
Immature Granulocytes: 0 %
Lymphocytes Absolute: 1.7 10*3/uL (ref 0.7–3.1)
Lymphs: 42 %
MCH: 30.2 pg (ref 26.6–33.0)
MCHC: 33.5 g/dL (ref 31.5–35.7)
MCV: 90 fL (ref 79–97)
Monocytes Absolute: 0.4 10*3/uL (ref 0.1–0.9)
Monocytes: 10 %
Neutrophils Absolute: 1.9 10*3/uL (ref 1.4–7.0)
Neutrophils: 44 %
Platelets: 257 10*3/uL (ref 150–450)
RBC: 4.97 x10E6/uL (ref 4.14–5.80)
RDW: 12.9 % (ref 11.6–15.4)
WBC: 4.2 10*3/uL (ref 3.4–10.8)

## 2021-12-16 LAB — TSH: TSH: 3.55 u[IU]/mL (ref 0.450–4.500)

## 2021-12-16 LAB — PSA: Prostate Specific Ag, Serum: 1.1 ng/mL (ref 0.0–4.0)

## 2021-12-31 ENCOUNTER — Ambulatory Visit (INDEPENDENT_AMBULATORY_CARE_PROVIDER_SITE_OTHER): Payer: 59 | Admitting: Nurse Practitioner

## 2021-12-31 ENCOUNTER — Encounter: Payer: Self-pay | Admitting: Nurse Practitioner

## 2021-12-31 VITALS — BP 129/84 | HR 58 | Resp 18 | Ht 72.0 in | Wt 207.0 lb

## 2021-12-31 DIAGNOSIS — I10 Essential (primary) hypertension: Secondary | ICD-10-CM | POA: Diagnosis not present

## 2021-12-31 DIAGNOSIS — Z0001 Encounter for general adult medical examination with abnormal findings: Secondary | ICD-10-CM

## 2021-12-31 DIAGNOSIS — E785 Hyperlipidemia, unspecified: Secondary | ICD-10-CM

## 2021-12-31 DIAGNOSIS — Z6828 Body mass index (BMI) 28.0-28.9, adult: Secondary | ICD-10-CM | POA: Diagnosis not present

## 2021-12-31 MED ORDER — ROSUVASTATIN CALCIUM 5 MG PO TABS: 5.0000 mg | ORAL_TABLET | Freq: Every day | ORAL | 5 refills | Status: DC

## 2021-12-31 NOTE — Progress Notes (Signed)
Complete physical exam   Patient: Randy Bell   DOB: December 11, 1970   51 y.o. Male  MRN: [id number] Visit Date: 12/31/2021    Chief Complaint  Patient presents with   Health Maintenance   Subjective    Randy Bell is a 51 y.o. male who presents today for a complete physical exam.  He reports consuming a  healthy  diet. Home exercise routine includes cycling and pickle ball . He generally feels well. He does not have additional problems to discuss today.   HPI  Annual physical  -hypertension  --generally well managed on current medication  -hyperlipidemia --on crestor 5 mg daily  --routine fasting labs done prior to this visit  --mild elevation of LDL with remainder of lipid panel normal.  --other labs essentially normal.  -He denies chest pain, chest pressure, or shortness of breath. He denies headaches or visual disturbances. He denies abdominal pain, nausea, vomiting, or changes in bowel or bladder habits.    Past Medical History:  Diagnosis Date   Arthritis    Basal cell carcinoma    Neck   Dizziness    Hyperlipidemia    Hypertension    Past Surgical History:  Procedure Laterality Date   BASAL CELL CARCINOMA EXCISION     Neck   COLONOSCOPY     FOOT SURGERY     TOTAL HIP ARTHROPLASTY Left 04/03/2020   Procedure: LEFT TOTAL HIP ARTHROPLASTY ANTERIOR APPROACH;  Surgeon: Mcarthur Rossetti, MD;  Location: [facility];  Service: Orthopedics;  Laterality: Left;   Social History   Socioeconomic History   Marital status: Married    Spouse name: Not on file   Number of children: Not on file   Years of education: Not on file   Highest education level: Not on file  Occupational History   Not on file  Tobacco Use   Smoking status: Former    Years: 3.00    Types: Cigarettes   Smokeless tobacco: Former   Tobacco comments:    quit 30 years   Vaping Use   Vaping Use: Never used  Substance and Sexual Activity   Alcohol use: Yes    Alcohol/week: 6.0 standard drinks of  alcohol    Types: 6 Standard drinks or equivalent per week   Drug use: Never   Sexual activity: Not on file  Other Topics Concern   Not on file  Social History Narrative   Not on file   Social Determinants of Health   Financial Resource Strain: Not on file  Food Insecurity: Not on file  Transportation Needs: Not on file  Physical Activity: Not on file  Stress: Not on file  Social Connections: Not on file  Intimate Partner Violence: Not on file   Family Status  Relation Name Status   Mother  (Not Specified)   Father  (Not Specified)   Neg Hx  (Not Specified)   Family History  Problem Relation Age of Onset   Hyperlipidemia Mother    Hyperlipidemia Father    Stroke Father    Colon cancer Neg Hx    Colon polyps Neg Hx    Esophageal cancer Neg Hx    Rectal cancer Neg Hx    Stomach cancer Neg Hx    No Known Allergies  Patient Care Team: Lorrene Reid, PA-C as PCP - General Elouise Munroe, MD as PCP - Cardiology (Cardiology) Eunice Blase, MD (Family Medicine) Mcarthur Rossetti, MD as Consulting Physician (Orthopedic Surgery) Dasher, Rayvon Char, MD (Dermatology)  Medications: Outpatient Medications Prior to Visit  Medication Sig   Ascorbic Acid (VITAMIN C PO) Take 1 tablet by mouth daily.   Glucosamine HCl (GLUCOSAMINE PO) Take 1 tablet by mouth 2 (two) times daily.   Multiple Vitamin (MULTIVITAMIN WITH MINERALS) TABS tablet Take 1 tablet by mouth daily.   Omega-3 Fatty Acids (FISH OIL PO) Take 1 capsule by mouth 2 (two) times daily.   [DISCONTINUED] hydrochlorothiazide (MICROZIDE) 12.5 MG capsule Take 1 capsule by mouth once daily   [DISCONTINUED] rosuvastatin (CRESTOR) 5 MG tablet Take 1 tablet by mouth once daily   HYDROcodone-acetaminophen (NORCO/VICODIN) 5-325 MG tablet Take 1-2 tablets by mouth every 6 (six) hours as needed for moderate pain.   No facility-administered medications prior to visit.    Review of Systems  Constitutional:  Negative for  activity change, chills, fatigue and fever.  HENT:  Negative for congestion, postnasal drip, rhinorrhea, sinus pressure, sinus pain, sneezing and sore throat.   Eyes: Negative.   Respiratory:  Negative for cough, shortness of breath and wheezing.   Cardiovascular:  Negative for chest pain and palpitations.  Gastrointestinal:  Negative for constipation, diarrhea, nausea and vomiting.  Endocrine: Negative for cold intolerance, heat intolerance, polydipsia and polyuria.  Genitourinary:  Negative for dysuria, frequency and urgency.       Has noted strong smell to urine intermittently over past 6 months. Started more frequently since he started working more frequently   Musculoskeletal:  Negative for back pain and myalgias.  Skin:  Negative for rash.  Allergic/Immunologic: Negative for environmental allergies.  Neurological:  Negative for dizziness, weakness and headaches.  Psychiatric/Behavioral:  The patient is not nervous/anxious.     Last CBC Lab Results  Component Value Date   WBC 4.2 12/15/2021   HGB 15.0 12/15/2021   HCT 44.8 12/15/2021   MCV 90 12/15/2021   MCH 30.2 12/15/2021   RDW 12.9 12/15/2021   PLT 257 50/35/4656   Last metabolic panel Lab Results  Component Value Date   GLUCOSE 101 (H) 12/15/2021   NA 140 12/15/2021   K 5.1 12/15/2021   CL 100 12/15/2021   CO2 24 12/15/2021   BUN 13 12/15/2021   CREATININE 1.08 12/15/2021   EGFR 82 12/15/2021   CALCIUM 10.2 12/15/2021   PROT 7.1 12/15/2021   ALBUMIN 4.7 12/15/2021   LABGLOB 2.4 12/15/2021   AGRATIO 2.0 12/15/2021   BILITOT 1.0 12/15/2021   ALKPHOS 123 (H) 12/15/2021   AST 27 12/15/2021   ALT 20 12/15/2021   ANIONGAP 9 04/04/2020   Last lipids Lab Results  Component Value Date   CHOL 196 12/15/2021   HDL 44 12/15/2021   LDLCALC 134 (H) 12/15/2021   TRIG 99 12/15/2021   CHOLHDL 4.5 12/15/2021   Last hemoglobin A1c Lab Results  Component Value Date   HGBA1C 5.4 12/15/2021   Last thyroid  functions Lab Results  Component Value Date   TSH 3.550 12/15/2021   T3TOTAL 101 09/19/2018       Objective     Today's Vitals   12/31/21 0841 12/31/21 0916  BP: (Abnormal) 134/94 129/84  Pulse: (Abnormal) 58   Resp: 18   SpO2: 100%   Weight: 207 lb (93.9 kg)   Height: 6' (1.829 m)    Body mass index is 28.07 kg/m.  BP Readings from Last 3 Encounters:  12/31/21 129/84  10/30/20 111/76  07/25/20 (Abnormal) 134/91    Wt Readings from Last 3 Encounters:  12/31/21 207 lb (93.9 kg)  10/30/20  209 lb (94.8 kg)  07/25/20 205 lb (93 kg)     Physical Exam Vitals and nursing note reviewed.  Constitutional:      Appearance: Normal appearance. He is well-developed.  HENT:     Head: Normocephalic and atraumatic.     Right Ear: Tympanic membrane, ear canal and external ear normal.     Left Ear: Tympanic membrane, ear canal and external ear normal.     Nose: Nose normal.     Mouth/Throat:     Mouth: Mucous membranes are moist.     Pharynx: Oropharynx is clear.  Eyes:     Extraocular Movements: Extraocular movements intact.     Conjunctiva/sclera: Conjunctivae normal.     Pupils: Pupils are equal, round, and reactive to light.  Neck:     Vascular: No carotid bruit.  Cardiovascular:     Rate and Rhythm: Normal rate and regular rhythm.     Pulses: Normal pulses.     Heart sounds: Normal heart sounds.  Pulmonary:     Effort: Pulmonary effort is normal.     Breath sounds: Normal breath sounds.  Abdominal:     General: Bowel sounds are normal. There is no distension.     Palpations: Abdomen is soft. There is no mass.     Tenderness: There is no abdominal tenderness. There is no right CVA tenderness, left CVA tenderness, guarding or rebound.     Hernia: No hernia is present.  Musculoskeletal:        General: Normal range of motion.     Cervical back: Normal range of motion and neck supple.  Lymphadenopathy:     Cervical: No cervical adenopathy.  Skin:    General: Skin  is warm and dry.     Capillary Refill: Capillary refill takes less than 2 seconds.  Neurological:     General: No focal deficit present.     Mental Status: He is alert and oriented to person, place, and time.  Psychiatric:        Mood and Affect: Mood normal.        Behavior: Behavior normal.        Thought Content: Thought content normal.        Judgment: Judgment normal.     Last depression screening scores   Row Labels 12/31/2021    8:42 AM 10/30/2020    3:09 PM 05/01/2020    2:47 PM  PHQ 2/9 Scores   Section Header. No data exists in this row.     PHQ - 2 Score   0 0 0  PHQ- 9 Score   0 0 0   Last fall risk screening   Row Labels 12/31/2021    8:43 AM  Fall Risk    Section Header. No data exists in this row.   Falls in the past year?   0  Number falls in past yr:   0  Injury with Fall?   0    Assessment & Plan     1. Encounter for general adult medical examination with abnormal findings Annual physical today   2. Essential hypertension Stable. Continue bp medication as prescribed   3. Hyperlipidemia, unspecified hyperlipidemia type Reviewed lalbs with mild elevation of LDL. Continue crestor 5 mg daily  - rosuvastatin (CRESTOR) 5 MG tablet; Take 1 tablet (5 mg total) by mouth daily.  Dispense: 30 tablet; Refill: 5  4. BMI 28.0-28.9,adult Discussed lowering calorie intake to 1500 calories per day and incorporating exercise into daily  routine to help lose weight. Will monitor.    Immunization History  Administered Date(s) Administered   Hepatitis A 10/14/2005, 07/03/2010   Hepatitis B 07/03/2010, 08/02/2010   Influenza Inj Mdck Quad Pf 10/22/2017   PFIZER(Purple Top)SARS-COV-2 Vaccination 04/10/2019, 05/01/2019   Td 03/29/2000   Tdap 07/03/2010, 07/25/2020    Health Maintenance  Topic Date Due   Zoster Vaccines- Shingrix (1 of 2) Never done   INFLUENZA VACCINE  08/25/2021   COVID-19 Vaccine (3 - 2023-24 season) 09/25/2021   COLONOSCOPY (Pts 45-63yr Insurance  coverage will need to be confirmed)  06/05/2027   DTaP/Tdap/Td (4 - Td or Tdap) 07/26/2030   Hepatitis C Screening  Completed   HIV Screening  Completed   HPV VACCINES  Aged Out    Discussed health benefits of physical activity, and encouraged him to engage in regular exercise appropriate for his age and condition.  Problem List Items Addressed This Visit       Cardiovascular and Mediastinum   Essential hypertension   Relevant Medications   rosuvastatin (CRESTOR) 5 MG tablet     Other   Hyperlipidemia   Relevant Medications   rosuvastatin (CRESTOR) 5 MG tablet   BMI 28.0-28.9,adult   Other Visit Diagnoses     Encounter for general adult medical examination with abnormal findings    -  Primary        Return in about 1 year (around 01/01/2023) for blood pressure, health maintenance exam, FBW a week prior to visit.        HRonnell Freshwater NP  [phone]) [phone](fax)  [place]

## 2022-01-14 ENCOUNTER — Other Ambulatory Visit: Payer: Self-pay

## 2022-01-14 DIAGNOSIS — R030 Elevated blood-pressure reading, without diagnosis of hypertension: Secondary | ICD-10-CM

## 2022-01-14 MED ORDER — HYDROCHLOROTHIAZIDE 12.5 MG PO CAPS: 12.5000 mg | ORAL_CAPSULE | Freq: Every day | ORAL | 3 refills | Status: DC

## 2022-01-18 DIAGNOSIS — Z6828 Body mass index (BMI) 28.0-28.9, adult: Secondary | ICD-10-CM | POA: Insufficient documentation

## 2022-01-18 DIAGNOSIS — I10 Essential (primary) hypertension: Secondary | ICD-10-CM | POA: Insufficient documentation

## 2022-04-14 ENCOUNTER — Telehealth: Payer: Self-pay

## 2022-04-14 DIAGNOSIS — E785 Hyperlipidemia, unspecified: Secondary | ICD-10-CM

## 2022-04-14 DIAGNOSIS — R030 Elevated blood-pressure reading, without diagnosis of hypertension: Secondary | ICD-10-CM

## 2022-04-14 NOTE — Telephone Encounter (Signed)
Pt is requesting a 90 day supply of:  hydrochlorothiazide (MICROZIDE) 12.5 MG capsule   rosuvastatin (CRESTOR) 5 MG tablet   Pharmacy: [facility] [place] to [facility] [employer] is requiring it to go to mail order  and be 90 day supply or they will not cover it per pt.  LOV 12/31/21 ROV 01/05/23

## 2022-04-15 MED ORDER — ROSUVASTATIN CALCIUM 5 MG PO TABS: 5.0000 mg | ORAL_TABLET | Freq: Every day | ORAL | 2 refills | Status: DC

## 2022-04-15 NOTE — Addendum Note (Signed)
Addended by: Gemma Payor on: 04/15/2022 08:54 AM   Modules accepted: Orders

## 2022-04-15 NOTE — Telephone Encounter (Signed)
Refill sent.

## 2022-04-19 ENCOUNTER — Telehealth: Payer: Self-pay | Admitting: *Deleted

## 2022-04-19 MED ORDER — HYDROCHLOROTHIAZIDE 12.5 MG PO CAPS: 12.5000 mg | ORAL_CAPSULE | Freq: Every day | ORAL | 3 refills | Status: DC

## 2022-04-19 MED ORDER — ROSUVASTATIN CALCIUM 5 MG PO TABS: 5.0000 mg | ORAL_TABLET | Freq: Every day | ORAL | 2 refills | Status: DC

## 2022-04-19 NOTE — Telephone Encounter (Signed)
Opened in error

## 2022-04-19 NOTE — Addendum Note (Signed)
Addended by: Gemma Payor on: 04/19/2022 10:51 AM   Modules accepted: Orders

## 2022-04-19 NOTE — Telephone Encounter (Signed)
PT calling stating that he needed to have both the the Rx's sent to the mail order pharmacy and that only the rosuvastatin was sent.  Please send a 90 day supply of    hydrochlorothiazide (MICROZIDE) 12.5 MG capsule   [facility] [place] to [employer]

## 2022-04-19 NOTE — Telephone Encounter (Signed)
Sent to [facility]

## 2022-11-26 ENCOUNTER — Other Ambulatory Visit: Payer: Self-pay | Admitting: Family Medicine

## 2022-11-26 DIAGNOSIS — E785 Hyperlipidemia, unspecified: Secondary | ICD-10-CM

## 2022-12-14 ENCOUNTER — Other Ambulatory Visit: Payer: Self-pay

## 2022-12-14 DIAGNOSIS — R030 Elevated blood-pressure reading, without diagnosis of hypertension: Secondary | ICD-10-CM

## 2022-12-14 DIAGNOSIS — E785 Hyperlipidemia, unspecified: Secondary | ICD-10-CM

## 2022-12-14 DIAGNOSIS — Z0000 Encounter for general adult medical examination without abnormal findings: Secondary | ICD-10-CM

## 2022-12-29 ENCOUNTER — Other Ambulatory Visit: Payer: 59

## 2022-12-30 ENCOUNTER — Other Ambulatory Visit: Payer: 59

## 2022-12-31 ENCOUNTER — Other Ambulatory Visit: Payer: 59

## 2022-12-31 DIAGNOSIS — E785 Hyperlipidemia, unspecified: Secondary | ICD-10-CM

## 2022-12-31 DIAGNOSIS — Z0000 Encounter for general adult medical examination without abnormal findings: Secondary | ICD-10-CM

## 2022-12-31 DIAGNOSIS — R030 Elevated blood-pressure reading, without diagnosis of hypertension: Secondary | ICD-10-CM

## 2023-01-01 LAB — CBC WITH DIFFERENTIAL/PLATELET
Basophils Absolute: 0 10*3/uL (ref 0.0–0.2)
Basos: 1 %
EOS (ABSOLUTE): 0.2 10*3/uL (ref 0.0–0.4)
Eos: 4 %
Hematocrit: 47.1 % (ref 37.5–51.0)
Hemoglobin: 15.6 g/dL (ref 13.0–17.7)
Immature Grans (Abs): 0 10*3/uL (ref 0.0–0.1)
Immature Granulocytes: 0 %
Lymphocytes Absolute: 2 10*3/uL (ref 0.7–3.1)
Lymphs: 42 %
MCH: 29.8 pg (ref 26.6–33.0)
MCHC: 33.1 g/dL (ref 31.5–35.7)
MCV: 90 fL (ref 79–97)
Monocytes Absolute: 0.4 10*3/uL (ref 0.1–0.9)
Monocytes: 9 %
Neutrophils Absolute: 2.1 10*3/uL (ref 1.4–7.0)
Neutrophils: 44 %
Platelets: 292 10*3/uL (ref 150–450)
RBC: 5.23 x10E6/uL (ref 4.14–5.80)
RDW: 12.5 % (ref 11.6–15.4)
WBC: 4.8 10*3/uL (ref 3.4–10.8)

## 2023-01-01 LAB — COMPREHENSIVE METABOLIC PANEL
ALT: 23 [IU]/L (ref 0–44)
AST: 26 [IU]/L (ref 0–40)
Albumin: 4.7 g/dL (ref 3.8–4.9)
Alkaline Phosphatase: 120 [IU]/L (ref 44–121)
BUN/Creatinine Ratio: 18 (ref 9–20)
BUN: 19 mg/dL (ref 6–24)
Bilirubin Total: 0.9 mg/dL (ref 0.0–1.2)
CO2: 25 mmol/L (ref 20–29)
Calcium: 10 mg/dL (ref 8.7–10.2)
Chloride: 100 mmol/L (ref 96–106)
Creatinine, Ser: 1.07 mg/dL (ref 0.76–1.27)
Globulin, Total: 2.4 g/dL (ref 1.5–4.5)
Glucose: 96 mg/dL (ref 70–99)
Potassium: 4.5 mmol/L (ref 3.5–5.2)
Sodium: 140 mmol/L (ref 134–144)
Total Protein: 7.1 g/dL (ref 6.0–8.5)
eGFR: 82 mL/min/{1.73_m2} (ref >59)

## 2023-01-01 LAB — LIPID PANEL
Chol/HDL Ratio: 5.7 {ratio} — ABNORMAL HIGH (ref 0.0–5.0)
Cholesterol, Total: 232 mg/dL — ABNORMAL HIGH (ref 100–199)
HDL: 41 mg/dL (ref >39)
LDL Chol Calc (NIH): 157 mg/dL — ABNORMAL HIGH (ref 0–99)
Triglycerides: 185 mg/dL — ABNORMAL HIGH (ref 0–149)
VLDL Cholesterol Cal: 34 mg/dL (ref 5–40)

## 2023-01-01 LAB — HEMOGLOBIN A1C
Est. average glucose Bld gHb Est-mCnc: 114 mg/dL
Hgb A1c MFr Bld: 5.6 % (ref 4.8–5.6)

## 2023-01-01 LAB — TSH: TSH: 4.86 u[IU]/mL — ABNORMAL HIGH (ref 0.450–4.500)

## 2023-01-05 ENCOUNTER — Ambulatory Visit (INDEPENDENT_AMBULATORY_CARE_PROVIDER_SITE_OTHER): Payer: 59 | Admitting: Family Medicine

## 2023-01-05 ENCOUNTER — Encounter: Payer: Self-pay | Admitting: Family Medicine

## 2023-01-05 VITALS — BP 161/101 | HR 53 | Resp 18 | Ht 72.0 in | Wt 209.0 lb

## 2023-01-05 DIAGNOSIS — R001 Bradycardia, unspecified: Secondary | ICD-10-CM | POA: Diagnosis not present

## 2023-01-05 DIAGNOSIS — Z0000 Encounter for general adult medical examination without abnormal findings: Secondary | ICD-10-CM | POA: Diagnosis not present

## 2023-01-05 DIAGNOSIS — I10 Essential (primary) hypertension: Secondary | ICD-10-CM | POA: Diagnosis not present

## 2023-01-05 DIAGNOSIS — M7022 Olecranon bursitis, left elbow: Secondary | ICD-10-CM

## 2023-01-05 DIAGNOSIS — E782 Mixed hyperlipidemia: Secondary | ICD-10-CM | POA: Diagnosis not present

## 2023-01-05 DIAGNOSIS — R7989 Other specified abnormal findings of blood chemistry: Secondary | ICD-10-CM

## 2023-01-05 MED ORDER — HYDROCHLOROTHIAZIDE 12.5 MG PO CAPS: 12.5000 mg | ORAL_CAPSULE | Freq: Every day | ORAL | 3 refills | Status: DC

## 2023-01-05 MED ORDER — ROSUVASTATIN CALCIUM 10 MG PO TABS
10.0000 mg | ORAL_TABLET | Freq: Every day | ORAL | 3 refills | Status: DC
Start: 2023-03-06 — End: 2023-03-04

## 2023-01-05 NOTE — Patient Instructions (Addendum)
You can take 2 tablets of your cholesterol medicine rosuvastatin every evening until you finish the prescriptions that you currently have.  After that, I will send the 10 mg tablets to [facility] for you.  We will recheck your cholesterol levels in about 2 months to make sure that this is the only change that we will need to make.  You can start with conservative management for the swelling on your elbow called olecranon bursitis.  You can use ibuprofen for the pain and try a compression sleeve or wrapping with an Ace bandage.  If it is bothersome enough that you would like to have it drained, I would recommend going to [facility] for them to take care of!

## 2023-01-05 NOTE — Progress Notes (Signed)
Complete physical exam  Patient: Randy Bell   DOB: Jan 30, 1970   52 y.o. Male  MRN: [id number]  Subjective:    Chief Complaint  Patient presents with   Annual Exam    Randy Bell is a 52 y.o. male who presents today for a complete physical exam. He reports consuming a general diet.  He cycles frequently.  He generally feels well other than a bothersome fluid-filled sac over his left elbow has been present for a few days.  He believes that he did bump his elbow several days ago.  He has had similar fluid buildup in his knee and in his hip previously.  He is interested in having it drained. He does not have additional problems to discuss today.    Most recent fall risk assessment:    01/05/2023    8:17 AM  Fall Risk   Falls in the past year? 0  Number falls in past yr: 0  Injury with Fall? 0  Risk for fall due to : No Fall Risks  Follow up Falls evaluation completed     Most recent depression and anxiety screenings:    01/05/2023    8:17 AM 12/31/2021    8:42 AM  PHQ 2/9 Scores  PHQ - 2 Score 0 0  PHQ- 9 Score 0 0      01/05/2023    8:18 AM 12/31/2021    8:42 AM 10/30/2020    3:10 PM  GAD 7 : Generalized Anxiety Score  Nervous, Anxious, on Edge 0 0 0  Control/stop worrying 0 0 0  Worry too much - different things 0 0 0  Trouble relaxing 0 0 0  Restless 0 0 0  Easily annoyed or irritable 0 0 0  Afraid - awful might happen 0 0 0  Total GAD 7 Score 0 0 0  Anxiety Difficulty Not difficult at all Not difficult at all Not difficult at all    Patient Active Problem List   Diagnosis Date Noted   Essential hypertension 01/18/2022   BMI 28.0-28.9,adult 01/18/2022   Status post total replacement of left hip 04/03/2020   Hyperlipidemia 10/05/2018   Bradycardia, unspecified 08/01/2018   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 08/01/2018   Primary osteoarthritis of left hip 05/16/2018    Past Surgical History:  Procedure Laterality Date   BASAL CELL CARCINOMA EXCISION      Neck   COLONOSCOPY     FOOT SURGERY     TOTAL HIP ARTHROPLASTY Left 04/03/2020   Procedure: LEFT TOTAL HIP ARTHROPLASTY ANTERIOR APPROACH;  Surgeon: Kathryne Hitch, MD;  Location: [facility];  Service: Orthopedics;  Laterality: Left;   Social History   Tobacco Use   Smoking status: Former    Types: Cigarettes    Passive exposure: Past   Smokeless tobacco: Former   Tobacco comments:    quit 30 years   Vaping Use   Vaping status: Never Used  Substance Use Topics   Alcohol use: Yes    Alcohol/week: 6.0 standard drinks of alcohol    Types: 6 Standard drinks or equivalent per week   Drug use: Never   Family History  Problem Relation Age of Onset   Hyperlipidemia Mother    Hyperlipidemia Father    Stroke Father    Colon cancer Neg Hx    Colon polyps Neg Hx    Esophageal cancer Neg Hx    Rectal cancer Neg Hx    Stomach cancer Neg Hx    No Known  Allergies   Patient Care Team: Melida Quitter, PA as PCP - General (Family Medicine) Parke Poisson, MD as PCP - Cardiology (Cardiology) Lavada Mesi, MD (Family Medicine) Kathryne Hitch, MD as Consulting Physician (Orthopedic Surgery) Dasher, Cliffton Asters, MD (Dermatology)   Outpatient Medications Prior to Visit  Medication Sig   Ascorbic Acid (VITAMIN C PO) Take 1 tablet by mouth daily.   Glucosamine HCl (GLUCOSAMINE PO) Take 1 tablet by mouth 2 (two) times daily.   Multiple Vitamin (MULTIVITAMIN WITH MINERALS) TABS tablet Take 1 tablet by mouth daily.   Omega-3 Fatty Acids (FISH OIL PO) Take 1 capsule by mouth 2 (two) times daily.   [DISCONTINUED] hydrochlorothiazide (MICROZIDE) 12.5 MG capsule Take 1 capsule (12.5 mg total) by mouth daily.   [DISCONTINUED] rosuvastatin (CRESTOR) 5 MG tablet TAKE 1 TABLET DAILY   No facility-administered medications prior to visit.    Review of Systems  Constitutional:  Negative for chills, fever and malaise/fatigue.  HENT:  Positive for hearing loss. Negative for  congestion.   Eyes:  Negative for blurred vision and double vision.  Respiratory:  Negative for cough and shortness of breath.   Cardiovascular:  Negative for chest pain, palpitations and leg swelling.  Gastrointestinal:  Negative for abdominal pain, constipation, diarrhea and heartburn.  Genitourinary:  Negative for frequency and urgency.  Musculoskeletal:  Negative for myalgias and neck pain.       Fluid buildup over the left elbow  Neurological:  Positive for dizziness (History of bradycardia). Negative for headaches.  Endo/Heme/Allergies:  Negative for polydipsia.  Psychiatric/Behavioral:  Negative for depression. The patient is not nervous/anxious.       Objective:    BP (!) 161/101 (BP Location: Right Arm, Patient Position: Sitting, Cuff Size: Normal)   Pulse (!) 53   Resp 18   Ht 6' (1.829 m)   Wt 209 lb (94.8 kg)   SpO2 100%   BMI 28.35 kg/m    Physical Exam Constitutional:      General: He is not in acute distress.    Appearance: Normal appearance.  HENT:     Head: Normocephalic and atraumatic.     Right Ear: Tympanic membrane, ear canal and external ear normal. There is no impacted cerumen.     Left Ear: Tympanic membrane, ear canal and external ear normal. There is no impacted cerumen.     Nose: Nose normal.     Mouth/Throat:     Mouth: Mucous membranes are moist.     Pharynx: Oropharynx is clear. No posterior oropharyngeal erythema.  Eyes:     Extraocular Movements: Extraocular movements intact.     Conjunctiva/sclera: Conjunctivae normal.     Pupils: Pupils are equal, round, and reactive to light.     Comments: Does not wear glasses or contacts  Neck:     Thyroid: No thyroid mass, thyromegaly or thyroid tenderness.  Cardiovascular:     Rate and Rhythm: Regular rhythm. Bradycardia present.     Heart sounds: Normal heart sounds. No murmur heard.    No friction rub. No gallop.  Pulmonary:     Effort: Pulmonary effort is normal. No respiratory distress.      Breath sounds: Normal breath sounds. No stridor. No wheezing or rales.  Abdominal:     General: Bowel sounds are normal.     Palpations: Abdomen is soft. There is no mass.     Tenderness: There is no abdominal tenderness.  Musculoskeletal:        General:  Normal range of motion.     Cervical back: Normal range of motion and neck supple.     Comments: Mobile fluid-filled swelling over left olecranon process, minimal tenderness to palpation.  No erythema or warmth.  Lymphadenopathy:     Cervical: No cervical adenopathy.  Skin:    General: Skin is warm and dry.  Neurological:     Mental Status: He is alert and oriented to person, place, and time.     Cranial Nerves: No cranial nerve deficit.     Motor: No weakness.     Deep Tendon Reflexes: Reflexes normal.  Psychiatric:        Mood and Affect: Mood normal.        Assessment & Plan:    Routine Health Maintenance and Physical Exam  Immunization History  Administered Date(s) Administered   Hepatitis A 10/14/2005, 07/03/2010   Hepatitis B 07/03/2010, 08/02/2010   Influenza Inj Mdck Quad Pf 10/22/2017   Influenza-Unspecified 10/27/2022   PFIZER(Purple Top)SARS-COV-2 Vaccination 04/10/2019, 05/01/2019   Td 03/29/2000   Tdap 07/03/2010, 07/25/2020   Typhoid Inactivated 03/29/2000   Typhoid Live 10/14/2005, 07/03/2010   Zoster Recombinant(Shingrix) 10/08/2021, 07/22/2022    Health Maintenance  Topic Date Due   COVID-19 Vaccine (3 - 2023-24 season) 01/21/2023 (Originally 09/26/2022)   Colonoscopy  06/05/2027   DTaP/Tdap/Td (4 - Td or Tdap) 07/26/2030   INFLUENZA VACCINE  Completed   Hepatitis C Screening  Completed   HIV Screening  Completed   Zoster Vaccines- Shingrix  Completed   HPV VACCINES  Aged Out    Reviewed most recent labs including CBC, CMP, lipid panel, A1C, TSH, and vitamin D. All within normal limits/stable from last check other than elevated TSH 4.860, LDL 157, triglycerides 185, total cholesterol 232, above  average risk based on cholesterol/HDL ratio.  Discussed health benefits of physical activity, and encouraged him to engage in regular exercise appropriate for his age and condition.  Wellness examination  Essential hypertension Assessment & Plan: BP goal <140/90.  Above goal today.  Check blood pressure at home and keep a blood pressure log for 2 weeks.  Continue hydrochlorothiazide 12.5 mg daily unless blood pressure remains above goal consistently at home as well.  Orders: -     hydroCHLOROthiazide; Take 1 capsule (12.5 mg total) by mouth daily.  Dispense: 90 capsule; Refill: 3 -     CBC with Differential/Platelet; Future -     Comprehensive metabolic panel; Future  Mixed hyperlipidemia Assessment & Plan: Last lipid panel: LDL 157, HDL 41, triglycerides 185, total cholesterol 232.  Increase rosuvastatin to 10 mg daily, recheck lipids and hepatic function in 2 months.  Orders: -     Rosuvastatin Calcium; Take 1 tablet (10 mg total) by mouth daily.  Dispense: 90 tablet; Refill: 3 -     Hepatic function panel; Future -     Lipid panel; Future -     CBC with Differential/Platelet; Future -     Comprehensive metabolic panel; Future -     Lipid panel; Future  Bradycardia, unspecified Assessment & Plan: Continued bradycardia.  Patient has occasional episodes of orthostatic dizziness but no changes from when he was initially evaluated by cardiology.  Will continue to monitor, if any changes then recommend reevaluation with cardiology.   Elevated TSH -     TSH Rfx on Abnormal to Free T4; Future  Olecranon bursitis of left elbow  Recommend starting with conservative treatment of olecranon bursitis including NSAIDs, ice/heat, gentle compression.  Patient  aware that if he desires drainage or if symptoms worsen, he should contact PCP for referral to orthopedics or go to orthopedic urgent care at [facility].  Return in about 2 months (around 03/08/2023) for fasting blood work; 6 months  for HTN, HLD, fasting labs 1 week prior.     Melida Quitter, PA

## 2023-01-05 NOTE — Assessment & Plan Note (Signed)
BP goal <140/90.  Above goal today.  Check blood pressure at home and keep a blood pressure log for 2 weeks.  Continue hydrochlorothiazide 12.5 mg daily unless blood pressure remains above goal consistently at home as well.

## 2023-01-05 NOTE — Assessment & Plan Note (Signed)
Continued bradycardia.  Patient has occasional episodes of orthostatic dizziness but no changes from when he was initially evaluated by cardiology.  Will continue to monitor, if any changes then recommend reevaluation with cardiology.

## 2023-01-05 NOTE — Assessment & Plan Note (Signed)
Last lipid panel: LDL 157, HDL 41, triglycerides 185, total cholesterol 232.  Increase rosuvastatin to 10 mg daily, recheck lipids and hepatic function in 2 months.

## 2023-03-03 ENCOUNTER — Encounter: Payer: Self-pay | Admitting: Family Medicine

## 2023-03-03 ENCOUNTER — Other Ambulatory Visit: Payer: Self-pay | Admitting: Nurse Practitioner

## 2023-03-03 DIAGNOSIS — I10 Essential (primary) hypertension: Secondary | ICD-10-CM

## 2023-03-03 MED ORDER — HYDROCHLOROTHIAZIDE 25 MG PO TABS: 25.0000 mg | ORAL_TABLET | Freq: Every day | ORAL | 0 refills | Status: DC

## 2023-03-03 NOTE — Telephone Encounter (Signed)
 Please call to see what his blood pressure has been at home. I will need to know this to determine if he will need a refill of his current dose, or if he needs a higher dose.

## 2023-03-03 NOTE — Telephone Encounter (Signed)
 LVM for pt to call office to inquire about below.

## 2023-03-03 NOTE — Telephone Encounter (Signed)
 Copied from CRM [id number]. Topic: Clinical - Prescription Issue >> Mar 03, 2023  2:53 PM Deleta HERO wrote: Reason for CRM: hydrochlorothiazide  (MICROZIDE ) 12.5 MG capsule needs to be sent to a different pharmacy: [facility] [address] [facility] [address], [place] - [address]. Patient is needing an additional refill for rosuvastatin  (CRESTOR ) 10 MG tablet.

## 2023-03-04 ENCOUNTER — Other Ambulatory Visit: Payer: Self-pay | Admitting: Family Medicine

## 2023-03-04 ENCOUNTER — Encounter: Payer: Self-pay | Admitting: Family Medicine

## 2023-03-04 DIAGNOSIS — E782 Mixed hyperlipidemia: Secondary | ICD-10-CM

## 2023-03-04 DIAGNOSIS — I10 Essential (primary) hypertension: Secondary | ICD-10-CM

## 2023-03-04 MED ORDER — HYDROCHLOROTHIAZIDE 25 MG PO TABS: 25.0000 mg | ORAL_TABLET | Freq: Every day | ORAL | 0 refills | Status: DC

## 2023-03-04 MED ORDER — ROSUVASTATIN CALCIUM 10 MG PO TABS: 10.0000 mg | ORAL_TABLET | Freq: Every day | ORAL | 3 refills | Status: DC

## 2023-03-08 ENCOUNTER — Other Ambulatory Visit: Payer: Self-pay | Admitting: Family Medicine

## 2023-03-08 DIAGNOSIS — I10 Essential (primary) hypertension: Secondary | ICD-10-CM

## 2023-03-08 MED ORDER — HYDROCHLOROTHIAZIDE 12.5 MG PO CAPS
12.5000 mg | ORAL_CAPSULE | Freq: Every day | ORAL | 1 refills | Status: DC
Start: 2023-03-08 — End: 2023-08-16

## 2023-03-08 NOTE — Progress Notes (Signed)
Hydrochlorothiazide 12.5 mg capsules sent

## 2023-03-09 ENCOUNTER — Other Ambulatory Visit: Payer: 59

## 2023-03-10 ENCOUNTER — Other Ambulatory Visit: Payer: 59

## 2023-03-10 DIAGNOSIS — E782 Mixed hyperlipidemia: Secondary | ICD-10-CM

## 2023-03-10 DIAGNOSIS — R7989 Other specified abnormal findings of blood chemistry: Secondary | ICD-10-CM

## 2023-03-10 DIAGNOSIS — I10 Essential (primary) hypertension: Secondary | ICD-10-CM

## 2023-03-11 LAB — COMPREHENSIVE METABOLIC PANEL
ALT: 28 [IU]/L (ref 0–44)
AST: 26 [IU]/L (ref 0–40)
Albumin: 4.6 g/dL (ref 3.8–4.9)
Alkaline Phosphatase: 104 [IU]/L (ref 44–121)
BUN/Creatinine Ratio: 17 (ref 9–20)
BUN: 17 mg/dL (ref 6–24)
Bilirubin Total: 1 mg/dL (ref 0.0–1.2)
CO2: 25 mmol/L (ref 20–29)
Calcium: 9.7 mg/dL (ref 8.7–10.2)
Chloride: 100 mmol/L (ref 96–106)
Creatinine, Ser: 1.01 mg/dL (ref 0.76–1.27)
Globulin, Total: 2.3 g/dL (ref 1.5–4.5)
Glucose: 90 mg/dL (ref 70–99)
Potassium: 4.3 mmol/L (ref 3.5–5.2)
Sodium: 139 mmol/L (ref 134–144)
Total Protein: 6.9 g/dL (ref 6.0–8.5)
eGFR: 88 mL/min/{1.73_m2} (ref >59)

## 2023-03-11 LAB — CBC WITH DIFFERENTIAL/PLATELET
Basophils Absolute: 0.1 10*3/uL (ref 0.0–0.2)
Basos: 1 %
EOS (ABSOLUTE): 0.1 10*3/uL (ref 0.0–0.4)
Eos: 3 %
Hematocrit: 44.3 % (ref 37.5–51.0)
Hemoglobin: 14.8 g/dL (ref 13.0–17.7)
Immature Grans (Abs): 0 10*3/uL (ref 0.0–0.1)
Immature Granulocytes: 1 %
Lymphocytes Absolute: 1.8 10*3/uL (ref 0.7–3.1)
Lymphs: 44 %
MCH: 30.7 pg (ref 26.6–33.0)
MCHC: 33.4 g/dL (ref 31.5–35.7)
MCV: 92 fL (ref 79–97)
Monocytes Absolute: 0.4 10*3/uL (ref 0.1–0.9)
Monocytes: 9 %
Neutrophils Absolute: 1.7 10*3/uL (ref 1.4–7.0)
Neutrophils: 42 %
Platelets: 270 10*3/uL (ref 150–450)
RBC: 4.82 x10E6/uL (ref 4.14–5.80)
RDW: 13.2 % (ref 11.6–15.4)
WBC: 4 10*3/uL (ref 3.4–10.8)

## 2023-03-11 LAB — LIPID PANEL
Chol/HDL Ratio: 4.7 {ratio} (ref 0.0–5.0)
Cholesterol, Total: 179 mg/dL (ref 100–199)
HDL: 38 mg/dL — ABNORMAL LOW (ref >39)
LDL Chol Calc (NIH): 116 mg/dL — ABNORMAL HIGH (ref 0–99)
Triglycerides: 137 mg/dL (ref 0–149)
VLDL Cholesterol Cal: 25 mg/dL (ref 5–40)

## 2023-03-11 LAB — HEPATIC FUNCTION PANEL: Bilirubin, Direct: 0.23 mg/dL (ref 0.00–0.40)

## 2023-03-11 LAB — TSH RFX ON ABNORMAL TO FREE T4: TSH: 4.18 u[IU]/mL (ref 0.450–4.500)

## 2023-06-29 ENCOUNTER — Other Ambulatory Visit: Payer: Self-pay | Admitting: *Deleted

## 2023-06-29 DIAGNOSIS — R7309 Other abnormal glucose: Secondary | ICD-10-CM

## 2023-06-29 DIAGNOSIS — R7989 Other specified abnormal findings of blood chemistry: Secondary | ICD-10-CM

## 2023-06-29 DIAGNOSIS — I10 Essential (primary) hypertension: Secondary | ICD-10-CM

## 2023-06-29 DIAGNOSIS — E782 Mixed hyperlipidemia: Secondary | ICD-10-CM

## 2023-06-30 ENCOUNTER — Other Ambulatory Visit: Payer: 59

## 2023-06-30 ENCOUNTER — Telehealth: Payer: Self-pay

## 2023-06-30 DIAGNOSIS — I10 Essential (primary) hypertension: Secondary | ICD-10-CM

## 2023-06-30 DIAGNOSIS — R7309 Other abnormal glucose: Secondary | ICD-10-CM

## 2023-06-30 DIAGNOSIS — R7989 Other specified abnormal findings of blood chemistry: Secondary | ICD-10-CM

## 2023-06-30 DIAGNOSIS — E782 Mixed hyperlipidemia: Secondary | ICD-10-CM

## 2023-06-30 NOTE — Telephone Encounter (Signed)
 Pt was in for Labs and was asking about appt availability. Pt states that he has been having this cough for quite sometime now. Pt has been taking Nyquil for the cough and to be able to rest and DayQuil gelcaps and doesn't really get relief from those. Pt reports the more he has to talk the more he coughs. Pt is asking for recommendations to help get rid of the cough not really to suppress the cough.

## 2023-07-01 LAB — LIPID PANEL

## 2023-07-02 LAB — COMPREHENSIVE METABOLIC PANEL WITH GFR
ALT: 21 IU/L (ref 0–44)
AST: 26 IU/L (ref 0–40)
Albumin: 4.3 g/dL (ref 3.8–4.9)
Alkaline Phosphatase: 131 IU/L — ABNORMAL HIGH (ref 44–121)
BUN/Creatinine Ratio: 12 (ref 9–20)
BUN: 12 mg/dL (ref 6–24)
Bilirubin Total: 0.8 mg/dL (ref 0.0–1.2)
CO2: 22 mmol/L (ref 20–29)
Calcium: 9.3 mg/dL (ref 8.7–10.2)
Chloride: 102 mmol/L (ref 96–106)
Creatinine, Ser: 1.02 mg/dL (ref 0.76–1.27)
Globulin, Total: 2.5 g/dL (ref 1.5–4.5)
Glucose: 83 mg/dL (ref 70–99)
Potassium: 4.3 mmol/L (ref 3.5–5.2)
Sodium: 138 mmol/L (ref 134–144)
Total Protein: 6.8 g/dL (ref 6.0–8.5)
eGFR: 87 mL/min/{1.73_m2} (ref >59)

## 2023-07-02 LAB — HEMOGLOBIN A1C
Est. average glucose Bld gHb Est-mCnc: 111 mg/dL
Hgb A1c MFr Bld: 5.5 % (ref 4.8–5.6)

## 2023-07-02 LAB — CBC WITH DIFFERENTIAL/PLATELET
Basophils Absolute: 0 10*3/uL (ref 0.0–0.2)
Basos: 1 %
EOS (ABSOLUTE): 0.2 10*3/uL (ref 0.0–0.4)
Eos: 4 %
Hematocrit: 45.2 % (ref 37.5–51.0)
Hemoglobin: 15.1 g/dL (ref 13.0–17.7)
Immature Grans (Abs): 0 10*3/uL (ref 0.0–0.1)
Immature Granulocytes: 0 %
Lymphocytes Absolute: 1.3 10*3/uL (ref 0.7–3.1)
Lymphs: 29 %
MCH: 30.9 pg (ref 26.6–33.0)
MCHC: 33.4 g/dL (ref 31.5–35.7)
MCV: 92 fL (ref 79–97)
Monocytes Absolute: 0.6 10*3/uL (ref 0.1–0.9)
Monocytes: 13 %
Neutrophils Absolute: 2.4 10*3/uL (ref 1.4–7.0)
Neutrophils: 53 %
Platelets: 203 10*3/uL (ref 150–450)
RBC: 4.89 x10E6/uL (ref 4.14–5.80)
RDW: 12.8 % (ref 11.6–15.4)
WBC: 4.6 10*3/uL (ref 3.4–10.8)

## 2023-07-02 LAB — LIPID PANEL
Chol/HDL Ratio: 4.6 ratio (ref 0.0–5.0)
Cholesterol, Total: 151 mg/dL (ref 100–199)
HDL: 33 mg/dL — ABNORMAL LOW (ref >39)
LDL Chol Calc (NIH): 91 mg/dL (ref 0–99)
Triglycerides: 150 mg/dL — ABNORMAL HIGH (ref 0–149)
VLDL Cholesterol Cal: 27 mg/dL (ref 5–40)

## 2023-07-02 LAB — TSH: TSH: 3.32 u[IU]/mL (ref 0.450–4.500)

## 2023-07-03 ENCOUNTER — Ambulatory Visit: Payer: Self-pay | Admitting: Family Medicine

## 2023-07-07 ENCOUNTER — Ambulatory Visit: Payer: 59 | Admitting: Family Medicine

## 2023-07-14 ENCOUNTER — Encounter: Payer: Self-pay | Admitting: Family Medicine

## 2023-07-14 ENCOUNTER — Ambulatory Visit (INDEPENDENT_AMBULATORY_CARE_PROVIDER_SITE_OTHER): Admitting: Family Medicine

## 2023-07-14 VITALS — BP 120/79 | HR 55 | Ht 72.0 in | Wt 206.4 lb

## 2023-07-14 DIAGNOSIS — E782 Mixed hyperlipidemia: Secondary | ICD-10-CM

## 2023-07-14 NOTE — Progress Notes (Unsigned)
   Established Patient Office Visit  Subjective   Patient ID: Randy Bell, male    DOB: 20-Jun-1970  Age: 53 y.o. MRN: [id number]  Chief Complaint  Patient presents with   Medical Management of Chronic Issues    HPI  Subjective - Presents for 13-month follow-up for blood pressure and cholesterol management - Reports history of fluid on elbow (bursitis) which has resolved - Reports 3-week history of cough, initially severe but now improving - Cough symptoms: residual mild cough with chest congestion, productive of sputum in past week - Symptoms began 2.5 weeks ago, significantly improved since weekend - Self-treated with Nyquil nightly for 2 weeks and Mucinex (without DM) during daytime without much relief - Has resumed daily cycling exercise this week after 2-week hiatus - Reports intermittent dull pain in flank region for past 2 months, radiating from back to side - Denies dysuria, urinary frequency or other urinary symptoms - Denies muscle aches, cramps, or joint pains from rosuvastatin   Medications: hydrochlorothiazide  for hypertension, rosuvastatin  10mg  daily (recently increased from 5mg ) for hypercholesterolemia.  PMH: hypercholesterolemia, hypertension, history of syncope at age 53, history of dizziness with positional changes. PSH: none mentioned. FH: father age 36 with history of cardiac disease requiring 3 stents. Social Hx: exercises regularly with daily cycling.  ROS: Positive for mild productive cough, chest congestion, intermittent flank pain. Negative for dysuria, urinary frequency, muscle aches, joint pain.    The 10-year ASCVD risk score (Arnett DK, et al., 2019) is: 6.1%  Health Maintenance Due  Topic Date Due   Hepatitis B Vaccines (3 of 3 - 19+ 3-dose series) 01/02/2011   COVID-19 Vaccine (3 - 2024-25 season) 09/26/2022      Objective:     BP 120/79   Pulse (!) 55   Ht 6' (1.829 m)   Wt 206 lb 6.4 oz (93.6 kg)   SpO2 99%   BMI 27.99 kg/m     Physical Exam Gen: alert, oriented.  Pulm: no resp distress Psych: pleasant affect   No results found for any visits on 07/14/23.      Assessment & Plan:   Mixed hyperlipidemia Assessment & Plan: History of elevated LDL (highest noted 170), currently on rosuvastatin  10mg  daily (recently increased from 5mg ). No side effects from medication. Family history of cardiac disease in father. - Continue rosuvastatin  10mg  daily - Counseled on dietary modifications to reduce saturated fat intake - Discussed risk factors and genetic component of hypercholesterolemia - Continue regular exercise   Resolving Upper Respiratory Infection 3-week history of cough, now improving with productive sputum. - Discussed expected duration of post-viral cough (4-6 weeks) - Recommended Mucinex DM (guaifenesin with dextromethorphan) for both mucus expectoration and cough suppression - Return if symptoms persist beyond 6 weeks for chest x-ray consideration   Intermittent Flank Pain 70-month history of intermittent dull flank pain, no urinary symptoms. - Possible renal stone, likely passed if symptoms improving - Return for CT stone study if pain recurs - Family history of kidney stones noted   Return in about 6 months (around 01/13/2024) for physical.    Toribio MARLA Slain, MD

## 2023-07-14 NOTE — Patient Instructions (Addendum)
 It was nice to see you today,  We addressed the following topics today: -Overall your cholesterol levels are good.  They are at goal.  Continue with your current medications. - I will see you back in 6 months for your physical and then after that if everything is still good we will see you back yearly.  Have a great day,  Etha Henle, MD

## 2023-07-19 NOTE — Assessment & Plan Note (Signed)
 History of elevated LDL (highest noted 170), currently on rosuvastatin  10mg  daily (recently increased from 5mg ). No side effects from medication. Family history of cardiac disease in father. - Continue rosuvastatin  10mg  daily - Counseled on dietary modifications to reduce saturated fat intake - Discussed risk factors and genetic component of hypercholesterolemia - Continue regular exercise

## 2023-08-15 ENCOUNTER — Other Ambulatory Visit: Payer: Self-pay | Admitting: Family Medicine

## 2023-08-15 DIAGNOSIS — I10 Essential (primary) hypertension: Secondary | ICD-10-CM

## 2023-12-30 ENCOUNTER — Other Ambulatory Visit: Payer: Self-pay | Admitting: Family Medicine

## 2023-12-30 DIAGNOSIS — I10 Essential (primary) hypertension: Secondary | ICD-10-CM

## 2023-12-30 DIAGNOSIS — E782 Mixed hyperlipidemia: Secondary | ICD-10-CM

## 2023-12-30 DIAGNOSIS — Z6828 Body mass index (BMI) 28.0-28.9, adult: Secondary | ICD-10-CM

## 2024-01-05 ENCOUNTER — Other Ambulatory Visit

## 2024-01-05 DIAGNOSIS — Z6828 Body mass index (BMI) 28.0-28.9, adult: Secondary | ICD-10-CM

## 2024-01-05 DIAGNOSIS — E782 Mixed hyperlipidemia: Secondary | ICD-10-CM

## 2024-01-05 DIAGNOSIS — I10 Essential (primary) hypertension: Secondary | ICD-10-CM

## 2024-01-06 ENCOUNTER — Ambulatory Visit: Payer: Self-pay | Admitting: Family Medicine

## 2024-01-06 LAB — CBC WITH DIFFERENTIAL/PLATELET
Basophils Absolute: 0 x10E3/uL (ref 0.0–0.2)
Basos: 1 %
EOS (ABSOLUTE): 0.1 x10E3/uL (ref 0.0–0.4)
Eos: 3 %
Hematocrit: 45.3 % (ref 37.5–51.0)
Hemoglobin: 15.1 g/dL (ref 13.0–17.7)
Immature Grans (Abs): 0 x10E3/uL (ref 0.0–0.1)
Immature Granulocytes: 0 %
Lymphocytes Absolute: 1.7 x10E3/uL (ref 0.7–3.1)
Lymphs: 40 %
MCH: 30.5 pg (ref 26.6–33.0)
MCHC: 33.3 g/dL (ref 31.5–35.7)
MCV: 92 fL (ref 79–97)
Monocytes Absolute: 0.4 x10E3/uL (ref 0.1–0.9)
Monocytes: 10 %
Neutrophils Absolute: 2 x10E3/uL (ref 1.4–7.0)
Neutrophils: 45 %
Platelets: 258 x10E3/uL (ref 150–450)
RBC: 4.95 x10E6/uL (ref 4.14–5.80)
RDW: 12.9 % (ref 11.6–15.4)
WBC: 4.3 x10E3/uL (ref 3.4–10.8)

## 2024-01-06 LAB — COMPREHENSIVE METABOLIC PANEL WITH GFR
ALT: 24 IU/L (ref 0–44)
AST: 27 IU/L (ref 0–40)
Albumin: 4.7 g/dL (ref 3.8–4.9)
Alkaline Phosphatase: 112 IU/L (ref 47–123)
BUN/Creatinine Ratio: 14 (ref 9–20)
BUN: 15 mg/dL (ref 6–24)
Bilirubin Total: 0.7 mg/dL (ref 0.0–1.2)
CO2: 26 mmol/L (ref 20–29)
Calcium: 9.8 mg/dL (ref 8.7–10.2)
Chloride: 101 mmol/L (ref 96–106)
Creatinine, Ser: 1.1 mg/dL (ref 0.76–1.27)
Globulin, Total: 2.4 g/dL (ref 1.5–4.5)
Glucose: 95 mg/dL (ref 70–99)
Potassium: 4.6 mmol/L (ref 3.5–5.2)
Sodium: 141 mmol/L (ref 134–144)
Total Protein: 7.1 g/dL (ref 6.0–8.5)
eGFR: 79 mL/min/1.73 (ref >59)

## 2024-01-06 LAB — LIPID PANEL
Chol/HDL Ratio: 5.5 ratio — ABNORMAL HIGH (ref 0.0–5.0)
Cholesterol, Total: 214 mg/dL — ABNORMAL HIGH (ref 100–199)
HDL: 39 mg/dL — ABNORMAL LOW (ref >39)
LDL Chol Calc (NIH): 138 mg/dL — ABNORMAL HIGH (ref 0–99)
Triglycerides: 206 mg/dL — ABNORMAL HIGH (ref 0–149)
VLDL Cholesterol Cal: 37 mg/dL (ref 5–40)

## 2024-01-06 LAB — TSH: TSH: 4.29 u[IU]/mL (ref 0.450–4.500)

## 2024-01-06 LAB — HEMOGLOBIN A1C
Est. average glucose Bld gHb Est-mCnc: 114 mg/dL
Hgb A1c MFr Bld: 5.6 % (ref 4.8–5.6)

## 2024-01-12 ENCOUNTER — Ambulatory Visit: Admitting: Family Medicine

## 2024-01-12 ENCOUNTER — Encounter: Payer: Self-pay | Admitting: Family Medicine

## 2024-01-12 VITALS — BP 137/83 | HR 62 | Ht 72.0 in | Wt 210.1 lb

## 2024-01-12 DIAGNOSIS — Z0000 Encounter for general adult medical examination without abnormal findings: Secondary | ICD-10-CM | POA: Diagnosis not present

## 2024-01-12 DIAGNOSIS — E782 Mixed hyperlipidemia: Secondary | ICD-10-CM

## 2024-01-12 DIAGNOSIS — I10 Essential (primary) hypertension: Secondary | ICD-10-CM | POA: Diagnosis not present

## 2024-01-12 NOTE — Assessment & Plan Note (Signed)
 At goal.  Continue hydrochlorothiazide .  Recheck bmp next year

## 2024-01-12 NOTE — Patient Instructions (Signed)
 It was nice to see you today,  We addressed the following topics today: - your cholesterol level went up.  I would like to check it again in 6 months before we change anything.  Continue taking your statin and blood pressure medication  Have a great day,  Rolan Slain, MD

## 2024-01-12 NOTE — Progress Notes (Unsigned)
° °  Annual physical  Subjective   Patient ID: Randy Bell, male    DOB: Jun 17, 1970  Age: 53 y.o. MRN: [id number]  Chief Complaint  Patient presents with   Annual Exam   HPI Wake is a 53 y.o. old male here  for annual exam.   Work:*** Relationship:*** Children:*** Tobacco:*** Alcohol:*** Recreational drugs:***  Diet:*** Exercise:***  Family history of prostate or colorectal cancer:***  Advance directive:***  Other providers:***  HPI  Separate, acute concerns today: ***  The 10-year ASCVD risk score (Arnett DK, et al., 2019) is: 9.8%  Health Maintenance Due  Topic Date Due   Hepatitis B Vaccines 19-59 Average Risk (3 of 3 - 19+ 3-dose series) 01/02/2011   Pneumococcal Vaccine: 50+ Years (1 of 1 - PCV) Never done   COVID-19 Vaccine (3 - 2025-26 season) 09/26/2023      Objective:     BP 137/83   Pulse 62   Ht 6' (1.829 m)   Wt 210 lb 1.9 oz (95.3 kg)   SpO2 99%   BMI 28.50 kg/m  {Vitals History (Optional):23777}  Physical Exam Gen: alert, oriented HEENT: perrla, eomi, mmm CV: rrr, no murmur Pulm: lctab. No wheeze or crackles.  GI: soft, nbs.  Nontender to palpation MSK: strength equal b/l. Normal gait Ext: no pedal edema Skin: warm and dry, no rashes Psych: pleasant affect.  Spontaneous speech   No results found for any visits on 01/12/24.      Assessment & Plan:   Physical exam, annual  Essential hypertension Assessment & Plan: At goal.  Continue hydrochlorothiazide .  Recheck bmp next year   Mixed hyperlipidemia Assessment & Plan: Elevated today.  Pt endorses compliance with minimal missed doses. No significant weight gain, no dietary changes.  Unsure why ldl has worsened from 91 to 138.  Will recheck again in 6 months and adjust if still elevated.       Return in about 1 year (around 01/11/2025) for physical.    Toribio MARLA Slain, MD

## 2024-01-12 NOTE — Assessment & Plan Note (Signed)
 Elevated today.  Pt endorses compliance with minimal missed doses. No significant weight gain, no dietary changes.  Unsure why ldl has worsened from 91 to 138.  Will recheck again in 6 months and adjust if still elevated.

## 2024-02-10 ENCOUNTER — Other Ambulatory Visit: Payer: Self-pay | Admitting: Family Medicine

## 2024-02-10 DIAGNOSIS — E782 Mixed hyperlipidemia: Secondary | ICD-10-CM

## 2024-07-12 ENCOUNTER — Other Ambulatory Visit

## 2025-01-07 ENCOUNTER — Other Ambulatory Visit

## 2025-01-14 ENCOUNTER — Encounter: Admitting: Family Medicine
